# Patient Record
Sex: Female | Born: 1975 | Race: White | Hispanic: No | Smoking: Never smoker
Health system: Southern US, Community
[De-identification: ages and names within clinical notes are randomized; demographics above are authoritative.]

## PROBLEM LIST (undated history)

## (undated) DIAGNOSIS — K219 Gastro-esophageal reflux disease without esophagitis: Secondary | ICD-10-CM

## (undated) DIAGNOSIS — M1990 Unspecified osteoarthritis, unspecified site: Secondary | ICD-10-CM

## (undated) DIAGNOSIS — D229 Melanocytic nevi, unspecified: Secondary | ICD-10-CM

## (undated) HISTORY — PX: ABDOMINAL HYSTERECTOMY: SHX81

## (undated) HISTORY — DX: Melanocytic nevi, unspecified: D22.9

## (undated) HISTORY — PX: CHOLECYSTECTOMY: SHX55

---

## 2007-04-07 ENCOUNTER — Ambulatory Visit: Payer: Self-pay | Admitting: Obstetrics & Gynecology

## 2007-04-07 ENCOUNTER — Inpatient Hospital Stay: Payer: Self-pay | Admitting: General Surgery

## 2007-05-08 ENCOUNTER — Ambulatory Visit: Payer: Self-pay | Admitting: General Surgery

## 2007-05-12 ENCOUNTER — Ambulatory Visit: Payer: Self-pay | Admitting: General Surgery

## 2009-11-25 HISTORY — PX: SINUSOTOMY: SHX291

## 2010-01-31 ENCOUNTER — Ambulatory Visit: Payer: Self-pay

## 2010-02-06 ENCOUNTER — Inpatient Hospital Stay: Payer: Self-pay

## 2010-02-22 ENCOUNTER — Ambulatory Visit: Payer: Self-pay | Admitting: Otolaryngology

## 2011-12-24 ENCOUNTER — Ambulatory Visit: Payer: Self-pay

## 2012-04-03 ENCOUNTER — Ambulatory Visit: Payer: Self-pay | Admitting: Internal Medicine

## 2013-01-13 ENCOUNTER — Ambulatory Visit: Payer: Self-pay | Admitting: Internal Medicine

## 2013-03-25 ENCOUNTER — Emergency Department: Payer: Self-pay | Admitting: Emergency Medicine

## 2014-05-16 ENCOUNTER — Encounter: Payer: Self-pay | Admitting: Rheumatology

## 2014-05-25 ENCOUNTER — Encounter: Payer: Self-pay | Admitting: Rheumatology

## 2014-06-25 ENCOUNTER — Encounter: Payer: Self-pay | Admitting: Rheumatology

## 2014-12-03 DIAGNOSIS — J0110 Acute frontal sinusitis, unspecified: Secondary | ICD-10-CM | POA: Insufficient documentation

## 2015-01-02 DIAGNOSIS — E559 Vitamin D deficiency, unspecified: Secondary | ICD-10-CM | POA: Insufficient documentation

## 2017-01-23 ENCOUNTER — Other Ambulatory Visit: Payer: Self-pay | Admitting: Neurology

## 2017-01-23 DIAGNOSIS — R202 Paresthesia of skin: Secondary | ICD-10-CM

## 2017-01-23 DIAGNOSIS — R2681 Unsteadiness on feet: Secondary | ICD-10-CM

## 2017-01-31 ENCOUNTER — Encounter: Payer: Self-pay | Admitting: Radiology

## 2017-01-31 ENCOUNTER — Ambulatory Visit
Admission: RE | Admit: 2017-01-31 | Discharge: 2017-01-31 | Disposition: A | Discharge status: Home / Self Care | Payer: BLUE CROSS/BLUE SHIELD | Source: Ambulatory Visit | Attending: Neurology | Admitting: Neurology

## 2017-01-31 DIAGNOSIS — R2681 Unsteadiness on feet: Secondary | ICD-10-CM

## 2017-01-31 DIAGNOSIS — R42 Dizziness and giddiness: Secondary | ICD-10-CM | POA: Insufficient documentation

## 2017-01-31 DIAGNOSIS — R2689 Other abnormalities of gait and mobility: Secondary | ICD-10-CM | POA: Diagnosis not present

## 2017-01-31 DIAGNOSIS — R202 Paresthesia of skin: Secondary | ICD-10-CM | POA: Diagnosis present

## 2017-01-31 MED ORDER — GADOBENATE DIMEGLUMINE 529 MG/ML IV SOLN
20.0000 mL | Freq: Once | INTRAVENOUS | Status: AC | PRN
  Administered 2017-01-31: 10 mL via INTRAVENOUS

## 2017-02-21 ENCOUNTER — Ambulatory Visit: Payer: BLUE CROSS/BLUE SHIELD | Attending: Neurology

## 2017-02-21 DIAGNOSIS — I10 Essential (primary) hypertension: Secondary | ICD-10-CM | POA: Diagnosis not present

## 2017-02-21 DIAGNOSIS — G4733 Obstructive sleep apnea (adult) (pediatric): Secondary | ICD-10-CM | POA: Insufficient documentation

## 2017-02-21 DIAGNOSIS — Z881 Allergy status to other antibiotic agents status: Secondary | ICD-10-CM | POA: Insufficient documentation

## 2017-02-21 DIAGNOSIS — Z882 Allergy status to sulfonamides status: Secondary | ICD-10-CM | POA: Insufficient documentation

## 2017-02-21 DIAGNOSIS — E119 Type 2 diabetes mellitus without complications: Secondary | ICD-10-CM | POA: Diagnosis not present

## 2017-02-21 DIAGNOSIS — R0683 Snoring: Secondary | ICD-10-CM | POA: Diagnosis present

## 2017-02-25 ENCOUNTER — Ambulatory Visit: Admission: RE | Admit: 2017-02-25 | Payer: BLUE CROSS/BLUE SHIELD | Source: Ambulatory Visit

## 2017-02-25 ENCOUNTER — Other Ambulatory Visit: Payer: Self-pay | Admitting: Surgery

## 2017-03-03 ENCOUNTER — Ambulatory Visit
Admission: RE | Admit: 2017-03-03 | Discharge: 2017-03-03 | Disposition: A | Discharge status: Home / Self Care | Payer: BLUE CROSS/BLUE SHIELD | Source: Ambulatory Visit | Attending: Surgery | Admitting: Surgery

## 2017-03-03 ENCOUNTER — Other Ambulatory Visit: Payer: Self-pay

## 2017-03-03 DIAGNOSIS — K219 Gastro-esophageal reflux disease without esophagitis: Secondary | ICD-10-CM | POA: Insufficient documentation

## 2017-03-17 ENCOUNTER — Encounter: Payer: Self-pay | Admitting: Registered"

## 2017-03-17 ENCOUNTER — Encounter: Payer: BLUE CROSS/BLUE SHIELD | Attending: Surgery | Admitting: Registered"

## 2017-03-17 DIAGNOSIS — E669 Obesity, unspecified: Secondary | ICD-10-CM

## 2017-03-17 DIAGNOSIS — Z713 Dietary counseling and surveillance: Secondary | ICD-10-CM | POA: Insufficient documentation

## 2017-03-17 DIAGNOSIS — Z6839 Body mass index (BMI) 39.0-39.9, adult: Secondary | ICD-10-CM | POA: Diagnosis not present

## 2017-03-17 NOTE — Progress Notes (Signed)
Pre-Op Assessment Visit:  Pre-Operative Sleeve Gastrectomy Surgery  Medical Nutrition Therapy:  Appt start time: 8:00  End time:  9:00  Patient was seen on 03/17/2017 for Pre-Operative Nutrition Assessment. Assessment and letter of approval faxed to [facility] Bariatric Surgery Program coordinator on 03/17/2017.   Pt expectation of surgery: 60 lb loss, lower cholesterol and blood pressure  Pt expectation of Dietitian: guidance on nutrients needed to ensure adequacy   Start weight at [facility]: 230.5 BMI: 39.57  Pt does not like water flavored packs, no seafood, no eggs. 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   Pt states this is her only visit with [place] prior to having surgery.   24 hr Dietary Recall: First Meal: Premier Protein shake Snack: fruit Second Meal: wrap (lettuce, tomato, [place], cheese) or salad   Snack: fruit, sometimes skips Third Meal: sometimes skips, salad or wrap Snack: sometimes Premier Protein shake  Beverages: water, coke, tea  Encouraged to engage in 150 minutes of moderate physical activity including cardiovascular and weight baring weekly  Handouts given during visit include:  . Pre-Op Goals . Bariatric Surgery Protein Shakes During the appointment today the following Pre-Op Goals were reviewed with the patient: . Maintain or lose weight as instructed by your surgeon . Make healthy food choices . Begin to limit portion sizes . Limited concentrated sugars and fried foods . Keep fat/sugar in the single digits per serving on        food labels . Practice CHEWING your food  (aim for 30 chews per bite or until applesauce consistency) . Practice not drinking 15 minutes before, during, and 30 minutes after each meal/snack . Avoid all carbonated beverages  . Avoid/limit caffeinated beverages  . Avoid all sugar-sweetened beverages . Consume 3 meals per day; eat every 3-5 hours . Make a list of non-food related activities . Aim for 64-100  ounces of FLUID daily  . Aim for at least 60-80 grams of PROTEIN daily . Look for a liquid protein source that contain ?15 g protein and ?5 g carbohydrate  (ex: shakes, drinks, shots)  -Follow diet recommendations listed below   Energy and Macronutrient Recomendations: Calories: 1600 Carbohydrate: 180 Protein: 120 Fat: 44  Demonstrated degree of understanding via:  Teach Back  Teaching Method Utilized:  Visual Auditory Hands on  Barriers to learning/adherence to lifestyle change: busy work-life schedule  Patient to call the [facility] to enroll in Pre-Op and Post-Op Nutrition Education when surgery date is scheduled.

## 2017-03-24 ENCOUNTER — Encounter: Payer: BLUE CROSS/BLUE SHIELD | Admitting: Skilled Nursing Facility1

## 2017-03-24 ENCOUNTER — Encounter: Payer: Self-pay | Admitting: Skilled Nursing Facility1

## 2017-03-24 DIAGNOSIS — E669 Obesity, unspecified: Secondary | ICD-10-CM

## 2017-03-24 NOTE — Progress Notes (Signed)
  Pre-Operative Nutrition Class:  Appt start time: 7737   End time:  1830.  Patient was seen on 03/24/2017 for Pre-Operative Bariatric Surgery Education at the [facility].   Surgery date:  Surgery type:  Start weight at [facility]: 230 Weight today: 231.8  TANITA  BODY COMP RESULTS  N/A   BMI (kg/m^2)    Fat Mass (lbs)    Fat Free Mass (lbs)    Total Body Water (lbs)    Samples given per MNT protocol. Patient educated on appropriate usage: OpurityMultivitamin Lot # [id number] Exp: 04/19  Bariatric Advantage Calcium Citrate Lot # [id number] Exp:  ProemierProtein  Lot #[id number] Exp: 21/dec/2018  The following the learning objectives were met by the patient during this course:  Identify Pre-Op Dietary Goals and will begin 2 weeks pre-operatively  Identify appropriate sources of fluids and proteins   State protein recommendations and appropriate sources pre and post-operatively  Identify Post-Operative Dietary Goals and will follow for 2 weeks post-operatively  Identify appropriate multivitamin and calcium sources  Describe the need for physical activity post-operatively and will follow MD recommendations  State when to call healthcare provider regarding medication questions or post-operative complications  Handouts given during class include:  Pre-Op Bariatric Surgery Diet Handout  Protein Shake Handout  Post-Op Bariatric Surgery Nutrition Handout  BELT Program Information Flyer  Support Group Information Flyer  [facility] Bariatric Supplements Price List  [facility]: [facility] MD.

## 2017-04-10 ENCOUNTER — Encounter: Payer: Self-pay | Admitting: Skilled Nursing Facility1

## 2017-04-10 ENCOUNTER — Encounter: Payer: BLUE CROSS/BLUE SHIELD | Attending: Surgery | Admitting: Skilled Nursing Facility1

## 2017-04-10 DIAGNOSIS — E669 Obesity, unspecified: Secondary | ICD-10-CM

## 2017-04-10 DIAGNOSIS — Z6839 Body mass index (BMI) 39.0-39.9, adult: Secondary | ICD-10-CM | POA: Insufficient documentation

## 2017-04-10 DIAGNOSIS — Z713 Dietary counseling and surveillance: Secondary | ICD-10-CM | POA: Insufficient documentation

## 2017-04-10 NOTE — Progress Notes (Signed)
   Assessment:   1 s tSWL Appointment. Pt states her surgery date is tentatively May 29th. Pt states she will Start the pre-op diet today. Pt states she recently discovered after coming off Cymbalta she has reflux. Pt states she does not like water flavored packs, seafood, and eggs.  Start Wt at [facility]: 230.5 Wt: 233.6.4 BMI:40.06  MEDICATIONS: see list   DIETARY INTAKE:  24-hr recall:  B ( AM): protein shake Snk ( AM):  L ( PM): meat and vegetables Snk ( PM): protein shake D ( PM): meat and vegetable Beverages: water  Usual physical activity: walking  Diet to Follow: 1600 calories 180 g carbohydrates 120 g protein 44 g fat   Nutritional Diagnosis:  [place]-3.3 Overweight/obesity related to past poor dietary habits and physical inactivity as evidenced by patient w/ recent sleeve gastrectomy surgery following dietary guidelines for continued weight loss.    Intervention:  Nutrition counseling for upcoming Bariatric Surgery.  Barriers to learning/adherence to lifestyle change: none identified   Demonstrated degree of understanding via:  Teach Back   Monitoring/Evaluation:  Dietary intake, exercise,  and body weight prn.

## 2018-01-29 ENCOUNTER — Encounter: Payer: BLUE CROSS/BLUE SHIELD | Attending: Surgery | Admitting: Skilled Nursing Facility1

## 2018-01-29 ENCOUNTER — Encounter: Payer: Self-pay | Admitting: Skilled Nursing Facility1

## 2018-01-29 DIAGNOSIS — Z800 Family history of malignant neoplasm of digestive organs: Secondary | ICD-10-CM | POA: Insufficient documentation

## 2018-01-29 DIAGNOSIS — Z833 Family history of diabetes mellitus: Secondary | ICD-10-CM | POA: Insufficient documentation

## 2018-01-29 DIAGNOSIS — E7800 Pure hypercholesterolemia, unspecified: Secondary | ICD-10-CM | POA: Diagnosis not present

## 2018-01-29 DIAGNOSIS — Z8541 Personal history of malignant neoplasm of cervix uteri: Secondary | ICD-10-CM | POA: Insufficient documentation

## 2018-01-29 DIAGNOSIS — Z90710 Acquired absence of both cervix and uterus: Secondary | ICD-10-CM | POA: Insufficient documentation

## 2018-01-29 DIAGNOSIS — Z823 Family history of stroke: Secondary | ICD-10-CM | POA: Diagnosis not present

## 2018-01-29 DIAGNOSIS — Z79899 Other long term (current) drug therapy: Secondary | ICD-10-CM | POA: Diagnosis not present

## 2018-01-29 DIAGNOSIS — Z8042 Family history of malignant neoplasm of prostate: Secondary | ICD-10-CM | POA: Insufficient documentation

## 2018-01-29 DIAGNOSIS — Z882 Allergy status to sulfonamides status: Secondary | ICD-10-CM | POA: Insufficient documentation

## 2018-01-29 DIAGNOSIS — Z8261 Family history of arthritis: Secondary | ICD-10-CM | POA: Insufficient documentation

## 2018-01-29 DIAGNOSIS — Z803 Family history of malignant neoplasm of breast: Secondary | ICD-10-CM | POA: Insufficient documentation

## 2018-01-29 DIAGNOSIS — Z8041 Family history of malignant neoplasm of ovary: Secondary | ICD-10-CM | POA: Insufficient documentation

## 2018-01-29 DIAGNOSIS — Z881 Allergy status to other antibiotic agents status: Secondary | ICD-10-CM | POA: Diagnosis not present

## 2018-01-29 DIAGNOSIS — Z8249 Family history of ischemic heart disease and other diseases of the circulatory system: Secondary | ICD-10-CM | POA: Insufficient documentation

## 2018-01-29 DIAGNOSIS — F419 Anxiety disorder, unspecified: Secondary | ICD-10-CM | POA: Insufficient documentation

## 2018-01-29 DIAGNOSIS — M1990 Unspecified osteoarthritis, unspecified site: Secondary | ICD-10-CM | POA: Insufficient documentation

## 2018-01-29 DIAGNOSIS — E669 Obesity, unspecified: Secondary | ICD-10-CM

## 2018-01-29 DIAGNOSIS — Z9889 Other specified postprocedural states: Secondary | ICD-10-CM | POA: Insufficient documentation

## 2018-01-29 DIAGNOSIS — Z836 Family history of other diseases of the respiratory system: Secondary | ICD-10-CM | POA: Diagnosis not present

## 2018-01-29 DIAGNOSIS — Z713 Dietary counseling and surveillance: Secondary | ICD-10-CM | POA: Insufficient documentation

## 2018-01-29 NOTE — Progress Notes (Signed)
Sleeve Gastrectomy   Assessment:  2nd SWL Appointment.  Pt states after finding out she could not have surgery she was angry so then did not keep up with any healthy behavior changes. Pt states losing her job has caused a lot of stress at that time as well. Pt states she has acid reflux which is worsened by soda.   Start Wt at [facility]: 230.5 Wt: 240.6 BMI:41.26  MEDICATIONS: see list   DIETARY INTAKE:  24-hr recall:  B (7-7:30 AM): overnight oats with coconut almond milk  Snk (10 AM): fruit L ( PM): salad sometimes with chicken usually not with cheese and bacon bites and seeds and croutons  Snk (3 PM): raw veggies D ( 7PM): green beans and sweet potato  Beverages: flavored water, soda  Usual physical activity: stationary bike and treadmill (limited due to arthritis in knees)  Diet to Follow: 1600 calories 180 g carbohydrates 120 g protein 44 g fat   Nutritional Diagnosis:  [place]-3.3 Overweight/obesity related to past poor dietary habits and physical inactivity as evidenced by patient w/ recent sleeve gastrectomy surgery following dietary guidelines for continued weight loss.    Intervention:  Nutrition counseling for upcoming Bariatric Surgery. Goals: -On any nutrition label: single digits of fat and sugar and double digits of protein  -Go through the materials and take notes and write down your questions for pre-op  Barriers to learning/adherence to lifestyle change: none identified   Demonstrated degree of understanding via:  Teach Back   Monitoring/Evaluation:  Dietary intake, exercise,  and body weight prn.

## 2018-02-17 ENCOUNTER — Telehealth: Payer: Self-pay | Admitting: Skilled Nursing Facility1

## 2018-02-17 NOTE — Telephone Encounter (Signed)
Dietitian asked pt if she would like to come into pre-op class for a refresher.  Pt responded yes.

## 2018-02-20 NOTE — Progress Notes (Signed)
03-03-17 (Epic) EKG and CXR

## 2018-02-20 NOTE — Patient Instructions (Addendum)
[name]  02/20/2018   Your procedure is scheduled on: 03-02-18   Report to [place]  EntranceReport to admitting at 9:15 AM    Call this number if you have problems the morning of surgery [phone]   Remember: Do not eat food or drink liquids :After Midnight.     Take these medicines the morning of surgery with A SIP OF WATER: None                                You may not have any metal on your body including hair pins and              piercings  Do not wear jewelry, make-up, lotions, powders or perfumes, deodorant             Do not wear nail polish.  Do not shave  48 hours prior to surgery.               Do not bring valuables to the hospital. [place].  Contacts, dentures or bridgework may not be worn into surgery.  Leave suitcase in the car. After surgery it may be brought to your room.                 Please read over the following fact sheets you were given: _____________________________________________________________________           [facility] - Preparing for Surgery Before surgery, you can play an important role.  Because skin is not sterile, your skin needs to be as free of germs as possible.  You can reduce the number of germs on your skin by washing with CHG (chlorahexidine gluconate) soap before surgery.  CHG is an antiseptic cleaner which kills germs and bonds with the skin to continue killing germs even after washing. Please DO NOT use if you have an allergy to CHG or antibacterial soaps.  If your skin becomes reddened/irritated stop using the CHG and inform your nurse when you arrive at Short Stay. Do not shave (including legs and underarms) for at least 48 hours prior to the first CHG shower.  You may shave your face/neck. Please follow these instructions carefully:  1.  Shower with CHG Soap the night before surgery and the  morning of Surgery.  2.  If you choose  to wash your hair, wash your hair first as usual with your  normal  shampoo.  3.  After you shampoo, rinse your hair and body thoroughly to remove the  shampoo.                           4.  Use CHG as you would any other liquid soap.  You can apply chg directly  to the skin and wash                       Gently with a scrungie or clean washcloth.  5.  Apply the CHG Soap to your body ONLY FROM THE NECK DOWN.   Do not use on face/ open  Wound or open sores. Avoid contact with eyes, ears mouth and genitals (private parts).                       Wash face,  Genitals (private parts) with your normal soap.             6.  Wash thoroughly, paying special attention to the area where your surgery  will be performed.  7.  Thoroughly rinse your body with warm water from the neck down.  8.  DO NOT shower/wash with your normal soap after using and rinsing off  the CHG Soap.                9.  Pat yourself dry with a clean towel.            10.  Wear clean pajamas.            11.  Place clean sheets on your bed the night of your first shower and do not  sleep with pets. Day of Surgery : Do not apply any lotions/deodorants the morning of surgery.  Please wear clean clothes to the hospital/surgery center.  FAILURE TO FOLLOW THESE INSTRUCTIONS MAY RESULT IN THE CANCELLATION OF YOUR SURGERY PATIENT SIGNATURE_________________________________  NURSE SIGNATURE__________________________________

## 2018-02-23 ENCOUNTER — Other Ambulatory Visit: Payer: Self-pay

## 2018-02-23 ENCOUNTER — Encounter: Payer: BLUE CROSS/BLUE SHIELD | Attending: Surgery | Admitting: Skilled Nursing Facility1

## 2018-02-23 ENCOUNTER — Encounter (HOSPITAL_COMMUNITY): Payer: Self-pay

## 2018-02-23 ENCOUNTER — Encounter (HOSPITAL_COMMUNITY)
Admission: RE | Admit: 2018-02-23 | Discharge: 2018-02-23 | Disposition: A | Discharge status: Home / Self Care | Payer: BLUE CROSS/BLUE SHIELD | Source: Ambulatory Visit | Attending: Surgery | Admitting: Surgery

## 2018-02-23 DIAGNOSIS — Z79899 Other long term (current) drug therapy: Secondary | ICD-10-CM | POA: Insufficient documentation

## 2018-02-23 DIAGNOSIS — Z713 Dietary counseling and surveillance: Secondary | ICD-10-CM | POA: Insufficient documentation

## 2018-02-23 DIAGNOSIS — Z882 Allergy status to sulfonamides status: Secondary | ICD-10-CM | POA: Insufficient documentation

## 2018-02-23 DIAGNOSIS — Z01812 Encounter for preprocedural laboratory examination: Secondary | ICD-10-CM | POA: Insufficient documentation

## 2018-02-23 DIAGNOSIS — Z800 Family history of malignant neoplasm of digestive organs: Secondary | ICD-10-CM | POA: Insufficient documentation

## 2018-02-23 DIAGNOSIS — Z8042 Family history of malignant neoplasm of prostate: Secondary | ICD-10-CM | POA: Diagnosis not present

## 2018-02-23 DIAGNOSIS — M1990 Unspecified osteoarthritis, unspecified site: Secondary | ICD-10-CM | POA: Diagnosis not present

## 2018-02-23 DIAGNOSIS — E7800 Pure hypercholesterolemia, unspecified: Secondary | ICD-10-CM | POA: Insufficient documentation

## 2018-02-23 DIAGNOSIS — Z90710 Acquired absence of both cervix and uterus: Secondary | ICD-10-CM | POA: Diagnosis not present

## 2018-02-23 DIAGNOSIS — Z9889 Other specified postprocedural states: Secondary | ICD-10-CM | POA: Insufficient documentation

## 2018-02-23 DIAGNOSIS — Z823 Family history of stroke: Secondary | ICD-10-CM | POA: Diagnosis not present

## 2018-02-23 DIAGNOSIS — Z833 Family history of diabetes mellitus: Secondary | ICD-10-CM | POA: Diagnosis not present

## 2018-02-23 DIAGNOSIS — Z8541 Personal history of malignant neoplasm of cervix uteri: Secondary | ICD-10-CM | POA: Insufficient documentation

## 2018-02-23 DIAGNOSIS — Z8041 Family history of malignant neoplasm of ovary: Secondary | ICD-10-CM | POA: Diagnosis not present

## 2018-02-23 DIAGNOSIS — Z803 Family history of malignant neoplasm of breast: Secondary | ICD-10-CM | POA: Diagnosis not present

## 2018-02-23 DIAGNOSIS — Z881 Allergy status to other antibiotic agents status: Secondary | ICD-10-CM | POA: Diagnosis not present

## 2018-02-23 DIAGNOSIS — Z8261 Family history of arthritis: Secondary | ICD-10-CM | POA: Diagnosis not present

## 2018-02-23 DIAGNOSIS — Z8249 Family history of ischemic heart disease and other diseases of the circulatory system: Secondary | ICD-10-CM | POA: Diagnosis not present

## 2018-02-23 DIAGNOSIS — F419 Anxiety disorder, unspecified: Secondary | ICD-10-CM | POA: Insufficient documentation

## 2018-02-23 DIAGNOSIS — E669 Obesity, unspecified: Secondary | ICD-10-CM

## 2018-02-23 DIAGNOSIS — Z836 Family history of other diseases of the respiratory system: Secondary | ICD-10-CM | POA: Insufficient documentation

## 2018-02-23 HISTORY — DX: Gastro-esophageal reflux disease without esophagitis: K21.9

## 2018-02-23 HISTORY — DX: Unspecified osteoarthritis, unspecified site: M19.90

## 2018-02-23 LAB — BASIC METABOLIC PANEL
ANION GAP: 8 (ref 5–15)
BUN: 9 mg/dL (ref 6–20)
CHLORIDE: 103 mmol/L (ref 101–111)
CO2: 28 mmol/L (ref 22–32)
Calcium: 9.3 mg/dL (ref 8.9–10.3)
Creatinine, Ser: 0.71 mg/dL (ref 0.44–1.00)
GFR calc Af Amer: >60 mL/min (ref >60)
GLUCOSE: 106 mg/dL — AB (ref 65–99)
POTASSIUM: 4.1 mmol/L (ref 3.5–5.1)
Sodium: 139 mmol/L (ref 135–145)

## 2018-02-23 LAB — CBC
HEMATOCRIT: 42.4 % (ref 36.0–46.0)
HEMOGLOBIN: 14.3 g/dL (ref 12.0–15.0)
MCH: 31.8 pg (ref 26.0–34.0)
MCHC: 33.7 g/dL (ref 30.0–36.0)
MCV: 94.4 fL (ref 78.0–100.0)
Platelets: 377 10*3/uL (ref 150–400)
RBC: 4.49 MIL/uL (ref 3.87–5.11)
RDW: 12.7 % (ref 11.5–15.5)
WBC: 7.9 10*3/uL (ref 4.0–10.5)

## 2018-02-24 ENCOUNTER — Encounter: Payer: Self-pay | Admitting: Skilled Nursing Facility1

## 2018-02-24 NOTE — Progress Notes (Signed)
Pre-Operative Nutrition Class:  Appt start time: 4481   End time:  1830.  Patient was seen on 02/23/2018 for Pre-Operative Bariatric Surgery Education at the [facility].   Surgery date:  Surgery type: sleeve Start weight at [facility]: 230.5 Weight today: 235  Samples given per MNT protocol. Patient educated on appropriate usage: Bariatric Advantage Multivitamin Lot # [id number] Exp: 07/19  Bariatric Advantage Calcium  Lot # [id number] Exp: sep-06-2018  Renee Pain Protein Shake Lot # [id number] Exp: oct-13-2019  The following the learning objectives were met by the patient during this course:  Identify Pre-Op Dietary Goals and will begin 2 weeks pre-operatively  Identify appropriate sources of fluids and proteins   State protein recommendations and appropriate sources pre and post-operatively  Identify Post-Operative Dietary Goals and will follow for 2 weeks post-operatively  Identify appropriate multivitamin and calcium sources  Describe the need for physical activity post-operatively and will follow MD recommendations  State when to call healthcare provider regarding medication questions or post-operative complications  Handouts given during class include:  Pre-Op Bariatric Surgery Diet Handout  Protein Shake Handout  Post-Op Bariatric Surgery Nutrition Handout  BELT Program Information Flyer  Support Group Information Flyer  [facility] Bariatric Supplements Price List  [facility]: [facility] MD.

## 2018-03-01 NOTE — H&P (Signed)
Krista Ferguson  Documented: 02/06/2018 2:45 PM  Location: [place] Office  Patient #: [id number]  DOB: 1976/04/11  Divorced / Language: Krista Ferguson / Race: White  Female      History of Present Illness Krista Ferguson B. Hassell Done MD; 02/06/2018 3:07 PM)  The patient is a 42 year old female who presents for a bariatric surgery evaluation. 42 year old WF referred by Dr. Rebecka Apley and scheduled for sleeve gastrectomy on April 8. She lives in [place] and commutes to [place] where she works for [employer].   I answered her questions about the surgery. Her UGI showed some reflux but no hiatal hernia and she has no history of hiatal hernia. She has had a prior lap chole and a hysterectomy.     Her BMI is 42 and her weight is 239.       Allergies Malachi Bonds, CMA; 02/06/2018 2:45 PM)  Levaquin *FLUOROQUINOLONES*   Sulfa Antibiotics     Medication History (Chemira Jones, CMA; 02/06/2018 2:46 PM)  Loratadine (10MG  Tablet, Oral) Active.    Vitals (Chemira Jones CMA; 02/06/2018 2:45 PM)  02/06/2018 2:45 PM  Weight: 239 lb Height: 63in  Body Surface Area: 2.09 m Body Mass Index: 42.34 kg/m   Pulse: 49 (Regular)   BP: 130/78 (Sitting, Left Arm, Standard)    Physical Exam (Alailah Safley B. Hassell Done MD; 02/06/2018 3:08 PM)  General  Note: WD obese WF NAD  HEENT unremarkable  Neck supple  Chest clear  Heart SR  Abdomen obese  Ext FROM      Assessment & Plan Krista Ferguson B. Hassell Done MD; 02/06/2018 3:09 PM)  MORBID OBESITY, UNSPECIFIED OBESITY TYPE (E66.01)  Sleeve gastrectomy.  She is aware of the risk and benefits of the procedure.    Matt B. Hassell Done, MD, FACS

## 2018-03-02 ENCOUNTER — Encounter (HOSPITAL_COMMUNITY): Payer: Self-pay | Admitting: *Deleted

## 2018-03-02 ENCOUNTER — Inpatient Hospital Stay (HOSPITAL_COMMUNITY): Payer: BLUE CROSS/BLUE SHIELD | Admitting: Certified Registered Nurse Anesthetist

## 2018-03-02 ENCOUNTER — Encounter (HOSPITAL_COMMUNITY)
Admission: RE | Disposition: A | Discharge status: Home / Self Care | Payer: Self-pay | Source: Ambulatory Visit | Attending: Surgery

## 2018-03-02 ENCOUNTER — Other Ambulatory Visit: Payer: Self-pay

## 2018-03-02 ENCOUNTER — Inpatient Hospital Stay (HOSPITAL_COMMUNITY)
Admission: RE | Admit: 2018-03-02 | Discharge: 2018-03-03 | DRG: 621 | Disposition: A | Discharge status: Home / Self Care | Payer: BLUE CROSS/BLUE SHIELD | Source: Ambulatory Visit | Attending: Surgery | Admitting: Surgery

## 2018-03-02 DIAGNOSIS — K449 Diaphragmatic hernia without obstruction or gangrene: Secondary | ICD-10-CM | POA: Diagnosis present

## 2018-03-02 DIAGNOSIS — Z882 Allergy status to sulfonamides status: Secondary | ICD-10-CM | POA: Diagnosis not present

## 2018-03-02 DIAGNOSIS — Z9049 Acquired absence of other specified parts of digestive tract: Secondary | ICD-10-CM

## 2018-03-02 DIAGNOSIS — K219 Gastro-esophageal reflux disease without esophagitis: Secondary | ICD-10-CM | POA: Diagnosis present

## 2018-03-02 DIAGNOSIS — Z9884 Bariatric surgery status: Secondary | ICD-10-CM

## 2018-03-02 DIAGNOSIS — Z881 Allergy status to other antibiotic agents status: Secondary | ICD-10-CM

## 2018-03-02 DIAGNOSIS — M1990 Unspecified osteoarthritis, unspecified site: Secondary | ICD-10-CM | POA: Diagnosis present

## 2018-03-02 DIAGNOSIS — Z90710 Acquired absence of both cervix and uterus: Secondary | ICD-10-CM

## 2018-03-02 DIAGNOSIS — Z6841 Body Mass Index (BMI) 40.0 and over, adult: Secondary | ICD-10-CM | POA: Diagnosis not present

## 2018-03-02 HISTORY — PX: LAPAROSCOPIC GASTRIC SLEEVE RESECTION: SHX5895

## 2018-03-02 LAB — COMPREHENSIVE METABOLIC PANEL
ALT: 21 U/L (ref 14–54)
AST: 20 U/L (ref 15–41)
Albumin: 4 g/dL (ref 3.5–5.0)
Alkaline Phosphatase: 58 U/L (ref 38–126)
Anion gap: 11 (ref 5–15)
BUN: 12 mg/dL (ref 6–20)
CHLORIDE: 103 mmol/L (ref 101–111)
CO2: 24 mmol/L (ref 22–32)
CREATININE: 0.61 mg/dL (ref 0.44–1.00)
Calcium: 9.2 mg/dL (ref 8.9–10.3)
GFR calc Af Amer: >60 mL/min (ref >60)
Glucose, Bld: 92 mg/dL (ref 65–99)
POTASSIUM: 4 mmol/L (ref 3.5–5.1)
SODIUM: 138 mmol/L (ref 135–145)
Total Bilirubin: 0.8 mg/dL (ref 0.3–1.2)
Total Protein: 7.7 g/dL (ref 6.5–8.1)

## 2018-03-02 LAB — CBC WITH DIFFERENTIAL/PLATELET
BASOS ABS: 0 10*3/uL (ref 0.0–0.1)
BASOS PCT: 0 %
Eosinophils Absolute: 0.1 10*3/uL (ref 0.0–0.7)
Eosinophils Relative: 1 %
HEMATOCRIT: 42.8 % (ref 36.0–46.0)
Hemoglobin: 13.8 g/dL (ref 12.0–15.0)
LYMPHS PCT: 35 %
Lymphs Abs: 2.8 10*3/uL (ref 0.7–4.0)
MCH: 30.5 pg (ref 26.0–34.0)
MCHC: 32.2 g/dL (ref 30.0–36.0)
MCV: 94.7 fL (ref 78.0–100.0)
MONO ABS: 0.7 10*3/uL (ref 0.1–1.0)
Monocytes Relative: 8 %
Neutro Abs: 4.4 10*3/uL (ref 1.7–7.7)
Neutrophils Relative %: 56 %
Platelets: 353 10*3/uL (ref 150–400)
RBC: 4.52 MIL/uL (ref 3.87–5.11)
RDW: 12.9 % (ref 11.5–15.5)
WBC: 7.9 10*3/uL (ref 4.0–10.5)

## 2018-03-02 LAB — TYPE AND SCREEN
ABO/RH(D): O POS
ANTIBODY SCREEN: NEGATIVE

## 2018-03-02 LAB — ABO/RH: ABO/RH(D): O POS

## 2018-03-02 SURGERY — GASTRECTOMY, SLEEVE, LAPAROSCOPIC
Anesthesia: General | Site: Abdomen

## 2018-03-02 MED ORDER — APREPITANT 40 MG PO CAPS
40.0000 mg | ORAL_CAPSULE | ORAL | Status: AC
  Administered 2018-03-02: 40 mg via ORAL
  Filled 2018-03-02: qty 1

## 2018-03-02 MED ORDER — LACTATED RINGERS IV SOLN
INTRAVENOUS | Status: DC
  Administered 2018-03-02 (×2): via INTRAVENOUS

## 2018-03-02 MED ORDER — 0.9 % SODIUM CHLORIDE (POUR BTL) OPTIME
TOPICAL | Status: DC | PRN
  Administered 2018-03-02: 1000 mL

## 2018-03-02 MED ORDER — HYDROMORPHONE HCL 1 MG/ML IJ SOLN
INTRAMUSCULAR | Status: AC
  Filled 2018-03-02: qty 1

## 2018-03-02 MED ORDER — OXYCODONE HCL 5 MG PO TABS: 5.0000 mg | ORAL_TABLET | Freq: Once | ORAL | Status: DC | PRN

## 2018-03-02 MED ORDER — PANTOPRAZOLE SODIUM 40 MG IV SOLR
40.0000 mg | Freq: Every day | INTRAVENOUS | Status: DC
  Administered 2018-03-02: 40 mg via INTRAVENOUS
  Filled 2018-03-02: qty 40

## 2018-03-02 MED ORDER — CHLORHEXIDINE GLUCONATE CLOTH 2 % EX PADS: 6.0000 | MEDICATED_PAD | Freq: Once | CUTANEOUS | Status: DC

## 2018-03-02 MED ORDER — BUPIVACAINE LIPOSOME 1.3 % IJ SUSP
20.0000 mL | Freq: Once | INTRAMUSCULAR | Status: AC
  Administered 2018-03-02: 20 mL
  Filled 2018-03-02: qty 20

## 2018-03-02 MED ORDER — SUGAMMADEX SODIUM 200 MG/2ML IV SOLN
INTRAVENOUS | Status: DC | PRN
  Administered 2018-03-02: 200 mg via INTRAVENOUS

## 2018-03-02 MED ORDER — HYDROMORPHONE HCL 1 MG/ML IJ SOLN
0.2500 mg | INTRAMUSCULAR | Status: DC | PRN
  Administered 2018-03-02 (×2): 0.5 mg via INTRAVENOUS

## 2018-03-02 MED ORDER — DEXAMETHASONE SODIUM PHOSPHATE 4 MG/ML IJ SOLN: 4.0000 mg | INTRAMUSCULAR | Status: DC

## 2018-03-02 MED ORDER — PROPOFOL 10 MG/ML IV BOLUS
INTRAVENOUS | Status: DC | PRN
  Administered 2018-03-02: 200 mg via INTRAVENOUS

## 2018-03-02 MED ORDER — ONDANSETRON HCL 4 MG/2ML IJ SOLN
INTRAMUSCULAR | Status: AC
  Filled 2018-03-02: qty 2

## 2018-03-02 MED ORDER — HEPARIN SODIUM (PORCINE) 5000 UNIT/ML IJ SOLN
5000.0000 [IU] | Freq: Three times a day (TID) | INTRAMUSCULAR | Status: DC
  Administered 2018-03-02 – 2018-03-03 (×2): 5000 [IU] via SUBCUTANEOUS
  Filled 2018-03-02 (×2): qty 1

## 2018-03-02 MED ORDER — ACETAMINOPHEN 160 MG/5ML PO SOLN
650.0000 mg | Freq: Four times a day (QID) | ORAL | Status: DC
  Administered 2018-03-02 – 2018-03-03 (×2): 650 mg via ORAL
  Filled 2018-03-02 (×2): qty 20.3

## 2018-03-02 MED ORDER — ROCURONIUM BROMIDE 50 MG/5ML IV SOSY
PREFILLED_SYRINGE | INTRAVENOUS | Status: DC | PRN
  Administered 2018-03-02: 20 mg via INTRAVENOUS
  Administered 2018-03-02: 40 mg via INTRAVENOUS
  Administered 2018-03-02: 20 mg via INTRAVENOUS
  Administered 2018-03-02: 10 mg via INTRAVENOUS

## 2018-03-02 MED ORDER — DEXAMETHASONE SODIUM PHOSPHATE 10 MG/ML IJ SOLN
INTRAMUSCULAR | Status: AC
  Filled 2018-03-02: qty 1

## 2018-03-02 MED ORDER — LIDOCAINE 2% (20 MG/ML) 5 ML SYRINGE
INTRAMUSCULAR | Status: DC | PRN
  Administered 2018-03-02: 1.5 mg/kg/h via INTRAVENOUS

## 2018-03-02 MED ORDER — MEPERIDINE HCL 50 MG/ML IJ SOLN: 6.2500 mg | INTRAMUSCULAR | Status: DC | PRN

## 2018-03-02 MED ORDER — PROMETHAZINE HCL 25 MG/ML IJ SOLN
INTRAMUSCULAR | Status: AC
  Filled 2018-03-02: qty 1

## 2018-03-02 MED ORDER — PROMETHAZINE HCL 25 MG/ML IJ SOLN
6.2500 mg | INTRAMUSCULAR | Status: DC | PRN
  Administered 2018-03-02: 12.5 mg via INTRAVENOUS

## 2018-03-02 MED ORDER — SODIUM CHLORIDE 0.9 % IJ SOLN
INTRAMUSCULAR | Status: AC
  Filled 2018-03-02: qty 10

## 2018-03-02 MED ORDER — LIDOCAINE 2% (20 MG/ML) 5 ML SYRINGE
INTRAMUSCULAR | Status: DC | PRN
  Administered 2018-03-02: 100 mg via INTRAVENOUS

## 2018-03-02 MED ORDER — OXYCODONE HCL 5 MG/5ML PO SOLN: 5.0000 mg | ORAL | Status: DC | PRN

## 2018-03-02 MED ORDER — SUGAMMADEX SODIUM 200 MG/2ML IV SOLN
INTRAVENOUS | Status: AC
  Filled 2018-03-02: qty 2

## 2018-03-02 MED ORDER — ACETAMINOPHEN 500 MG PO TABS
1000.0000 mg | ORAL_TABLET | ORAL | Status: AC
  Administered 2018-03-02: 1000 mg via ORAL
  Filled 2018-03-02: qty 2

## 2018-03-02 MED ORDER — OXYCODONE HCL 5 MG/5ML PO SOLN: 5.0000 mg | Freq: Once | ORAL | Status: DC | PRN

## 2018-03-02 MED ORDER — CEFOTETAN DISODIUM-DEXTROSE 2-2.08 GM-%(50ML) IV SOLR
2.0000 g | INTRAVENOUS | Status: AC
  Administered 2018-03-02: 2 g via INTRAVENOUS
  Filled 2018-03-02: qty 50

## 2018-03-02 MED ORDER — ONDANSETRON HCL 4 MG/2ML IJ SOLN: 4.0000 mg | INTRAMUSCULAR | Status: DC | PRN

## 2018-03-02 MED ORDER — SUCCINYLCHOLINE CHLORIDE 200 MG/10ML IV SOSY
PREFILLED_SYRINGE | INTRAVENOUS | Status: AC
  Filled 2018-03-02: qty 10

## 2018-03-02 MED ORDER — PROPOFOL 10 MG/ML IV BOLUS
INTRAVENOUS | Status: AC
  Filled 2018-03-02: qty 20

## 2018-03-02 MED ORDER — FENTANYL CITRATE (PF) 250 MCG/5ML IJ SOLN
INTRAMUSCULAR | Status: DC | PRN
  Administered 2018-03-02 (×4): 50 ug via INTRAVENOUS

## 2018-03-02 MED ORDER — KETAMINE HCL 10 MG/ML IJ SOLN
INTRAMUSCULAR | Status: DC | PRN
  Administered 2018-03-02: 30 mg via INTRAVENOUS

## 2018-03-02 MED ORDER — ROCURONIUM BROMIDE 10 MG/ML (PF) SYRINGE
PREFILLED_SYRINGE | INTRAVENOUS | Status: AC
  Filled 2018-03-02: qty 5

## 2018-03-02 MED ORDER — SODIUM CHLORIDE 0.9 % IJ SOLN
INTRAMUSCULAR | Status: DC | PRN
  Administered 2018-03-02: 10 mL

## 2018-03-02 MED ORDER — HEPARIN SODIUM (PORCINE) 5000 UNIT/ML IJ SOLN
5000.0000 [IU] | INTRAMUSCULAR | Status: AC
  Administered 2018-03-02: 5000 [IU] via SUBCUTANEOUS
  Filled 2018-03-02: qty 1

## 2018-03-02 MED ORDER — PREMIER PROTEIN SHAKE
2.0000 [oz_av] | ORAL | Status: DC
  Administered 2018-03-03 (×2): 2 [oz_av] via ORAL

## 2018-03-02 MED ORDER — EPHEDRINE SULFATE 50 MG/ML IJ SOLN
INTRAMUSCULAR | Status: DC | PRN
  Administered 2018-03-02: 10 mg via INTRAVENOUS

## 2018-03-02 MED ORDER — LIDOCAINE HCL 2 % IJ SOLN
INTRAMUSCULAR | Status: AC
  Filled 2018-03-02: qty 20

## 2018-03-02 MED ORDER — MORPHINE SULFATE (PF) 4 MG/ML IV SOLN: 1.0000 mg | INTRAVENOUS | Status: DC | PRN

## 2018-03-02 MED ORDER — SUCCINYLCHOLINE CHLORIDE 200 MG/10ML IV SOSY
PREFILLED_SYRINGE | INTRAVENOUS | Status: DC | PRN
  Administered 2018-03-02: 100 mg via INTRAVENOUS

## 2018-03-02 MED ORDER — LIDOCAINE 2% (20 MG/ML) 5 ML SYRINGE
INTRAMUSCULAR | Status: AC
  Filled 2018-03-02: qty 5

## 2018-03-02 MED ORDER — SCOPOLAMINE 1 MG/3DAYS TD PT72
1.0000 | MEDICATED_PATCH | TRANSDERMAL | Status: DC
  Administered 2018-03-02: 1.5 mg via TRANSDERMAL
  Filled 2018-03-02: qty 1

## 2018-03-02 MED ORDER — DEXAMETHASONE SODIUM PHOSPHATE 10 MG/ML IJ SOLN
INTRAMUSCULAR | Status: DC | PRN
  Administered 2018-03-02: 5 mg via INTRAVENOUS

## 2018-03-02 MED ORDER — LACTATED RINGERS IR SOLN
Status: DC | PRN
  Administered 2018-03-02: 1000 mL

## 2018-03-02 MED ORDER — MIDAZOLAM HCL 5 MG/5ML IJ SOLN
INTRAMUSCULAR | Status: DC | PRN
  Administered 2018-03-02: 2 mg via INTRAVENOUS

## 2018-03-02 MED ORDER — ONDANSETRON HCL 4 MG/2ML IJ SOLN
INTRAMUSCULAR | Status: DC | PRN
  Administered 2018-03-02: 4 mg via INTRAVENOUS

## 2018-03-02 MED ORDER — FENTANYL CITRATE (PF) 250 MCG/5ML IJ SOLN
INTRAMUSCULAR | Status: AC
  Filled 2018-03-02: qty 5

## 2018-03-02 MED ORDER — KCL IN DEXTROSE-NACL 20-5-0.45 MEQ/L-%-% IV SOLN
INTRAVENOUS | Status: DC
  Administered 2018-03-02: 19:00:00 via INTRAVENOUS
  Administered 2018-03-03: 100 mL via INTRAVENOUS
  Filled 2018-03-02 (×2): qty 1000

## 2018-03-02 MED ORDER — GABAPENTIN 300 MG PO CAPS
300.0000 mg | ORAL_CAPSULE | ORAL | Status: AC
  Administered 2018-03-02: 300 mg via ORAL
  Filled 2018-03-02: qty 1

## 2018-03-02 MED ORDER — MIDAZOLAM HCL 2 MG/2ML IJ SOLN
INTRAMUSCULAR | Status: AC
  Filled 2018-03-02: qty 2

## 2018-03-02 MED ORDER — EPHEDRINE 5 MG/ML INJ
INTRAVENOUS | Status: AC
  Filled 2018-03-02: qty 10

## 2018-03-02 SURGICAL SUPPLY — 56 items
APPLICATOR COTTON TIP 6IN STRL (MISCELLANEOUS) IMPLANT
APPLIER CLIP 5 13 M/L LIGAMAX5 (MISCELLANEOUS)
APPLIER CLIP ROT 10 11.4 M/L (STAPLE)
APPLIER CLIP ROT 13.4 12 LRG (CLIP) ×2
BLADE SURG 15 STRL LF DISP TIS (BLADE) ×1 IMPLANT
BLADE SURG 15 STRL SS (BLADE) ×1
CABLE HIGH FREQUENCY MONO STRZ (ELECTRODE) ×2 IMPLANT
CLIP APPLIE 5 13 M/L LIGAMAX5 (MISCELLANEOUS) IMPLANT
CLIP APPLIE ROT 10 11.4 M/L (STAPLE) IMPLANT
CLIP APPLIE ROT 13.4 12 LRG (CLIP) ×1 IMPLANT
DERMABOND ADVANCED (GAUZE/BANDAGES/DRESSINGS) ×1
DERMABOND ADVANCED .7 DNX12 (GAUZE/BANDAGES/DRESSINGS) ×1 IMPLANT
DEVICE SUT QUICK LOAD TK 5 (STAPLE) ×4 IMPLANT
DEVICE SUT TI-KNOT TK 5X26 (MISCELLANEOUS) ×2 IMPLANT
DEVICE SUTURE ENDOST 10MM (ENDOMECHANICALS) ×2 IMPLANT
DISSECTOR BLUNT TIP ENDO 5MM (MISCELLANEOUS) IMPLANT
ELECT REM PT RETURN 15FT ADLT (MISCELLANEOUS) ×2 IMPLANT
GAUZE SPONGE 4X4 12PLY STRL (GAUZE/BANDAGES/DRESSINGS) IMPLANT
GLOVE BIOGEL M 8.0 STRL (GLOVE) ×2 IMPLANT
GOWN STRL REUS W/TWL XL LVL3 (GOWN DISPOSABLE) ×8 IMPLANT
GRASPER SUT TROCAR 14GX15 (MISCELLANEOUS) ×2 IMPLANT
HANDLE STAPLE EGIA 4 XL (STAPLE) ×2 IMPLANT
HOVERMATT SINGLE USE (MISCELLANEOUS) ×2 IMPLANT
KIT BASIN OR (CUSTOM PROCEDURE TRAY) ×2 IMPLANT
MARKER SKIN DUAL TIP RULER LAB (MISCELLANEOUS) ×2 IMPLANT
NEEDLE SPNL 22GX3.5 QUINCKE BK (NEEDLE) ×2 IMPLANT
PACK UNIVERSAL I (CUSTOM PROCEDURE TRAY) ×2 IMPLANT
RELOAD TRI 45 ART MED THCK BLK (STAPLE) ×2 IMPLANT
RELOAD TRI 45 ART MED THCK PUR (STAPLE) IMPLANT
RELOAD TRI 60 ART MED THCK BLK (STAPLE) ×2 IMPLANT
RELOAD TRI 60 ART MED THCK PUR (STAPLE) ×4 IMPLANT
SCISSORS LAP 5X45 EPIX DISP (ENDOMECHANICALS) IMPLANT
SET IRRIG TUBING LAPAROSCOPIC (IRRIGATION / IRRIGATOR) ×2 IMPLANT
SHEARS HARMONIC ACE PLUS 45CM (MISCELLANEOUS) ×2 IMPLANT
SLEEVE ADV FIXATION 5X100MM (TROCAR) ×4 IMPLANT
SLEEVE GASTRECTOMY 36FR VISIGI (MISCELLANEOUS) ×2 IMPLANT
SOLUTION ANTI FOG 6CC (MISCELLANEOUS) ×2 IMPLANT
SPONGE LAP 18X18 X RAY DECT (DISPOSABLE) ×2 IMPLANT
STAPLER VISISTAT 35W (STAPLE) ×2 IMPLANT
SUT MNCRL AB 4-0 PS2 18 (SUTURE) ×2 IMPLANT
SUT SURGIDAC NAB ES-9 0 48 120 (SUTURE) ×4 IMPLANT
SUT VIC AB 4-0 SH 18 (SUTURE) ×2 IMPLANT
SUT VICRYL 0 TIES 12 18 (SUTURE) ×2 IMPLANT
SYR 10ML ECCENTRIC (SYRINGE) ×2 IMPLANT
SYR 20CC LL (SYRINGE) ×2 IMPLANT
SYR 50ML LL SCALE MARK (SYRINGE) ×2 IMPLANT
TOWEL OR 17X26 10 PK STRL BLUE (TOWEL DISPOSABLE) ×4 IMPLANT
TOWEL OR NON WOVEN STRL DISP B (DISPOSABLE) ×2 IMPLANT
TRAY FOLEY W/METER SILVER 16FR (SET/KITS/TRAYS/PACK) IMPLANT
TROCAR ADV FIXATION 5X100MM (TROCAR) ×2 IMPLANT
TROCAR BLADELESS 15MM (ENDOMECHANICALS) ×2 IMPLANT
TROCAR BLADELESS OPT 5 100 (ENDOMECHANICALS) ×2 IMPLANT
TUBE CALIBRATION LAPBAND (TUBING) IMPLANT
TUBING CONNECTING 10 (TUBING) ×2 IMPLANT
TUBING ENDO SMARTCAP (MISCELLANEOUS) ×2 IMPLANT
TUBING INSUF HEATED (TUBING) ×2 IMPLANT

## 2018-03-02 NOTE — Anesthesia Postprocedure Evaluation (Signed)
Anesthesia Post Note  Patient: Krista Ferguson  Procedure(s) Performed: LAPAROSCOPIC GASTRIC SLEEVE RESECTION WITH HIATAL HERNIA REPAIR AND UPPER ENDOSCOPY (N/A Abdomen)     Patient location during evaluation: PACU Anesthesia Type: General Level of consciousness: awake and alert Pain management: pain level controlled Vital Signs Assessment: post-procedure vital signs reviewed and stable Respiratory status: spontaneous breathing, nonlabored ventilation and respiratory function stable Cardiovascular status: blood pressure returned to baseline and stable Postop Assessment: no apparent nausea or vomiting Anesthetic complications: no    Last Vitals:  Vitals:   03/02/18 1345 03/02/18 1400  BP: (!) 144/91 (!) 140/99  Pulse: 83 79  Resp: 16 15  Temp:    SpO2: 99% 98%    Last Pain:  Vitals:   03/02/18 1400  TempSrc:   PainSc: [place]

## 2018-03-02 NOTE — Op Note (Signed)
Name:  Krista Ferguson MRN: [id number] Date of Surgery: 03/02/2018  Preop Diagnosis:  Morbid Obesity  Postop Diagnosis:  Morbid Obesity (Weight - 239, BMI - 42), S/P Gastric Sleeve resection  Procedure:  Upper endoscopy  (Intraoperative)  Surgeon:  Alphonsa Overall, M.D.  Anesthesia:  GET  Indications for procedure: Krista Ferguson is a 42 y.o. female whose primary care physician is Cletis Athens, MD and has completed a gastric sleeve resection today for weight loss by Dr. Hassell Done.  I am doing an intraoperative upper endoscopy to evaluate the gastric pouch after the sleeve gastrectomy.  Operative Note: The patient is under general anesthesia.  Dr. Hassell Done is laparoscoping the patient while I do an upper endoscopy to evaluate the stomach pouch.  With the patient intubated, I passed the Pentax upper endoscope without difficulty down the esophagus.  The esophagus was unremarkable.  The esophago-gastric junction was at 37 cm.    The mucosa of the stomach looked viable and the staple line was intact without bleeding.  I advanced the scope to the pylorus, but did not go through it.  While I insufflated the stomach pouch with air, Dr. Hassell Done  flooded the upper abdomen with saline to put the gastric pouch under saline.  There was no bubbling or evidence of a leak.  There was no evidence of narrowing of the pouch and the gastric sleeve looked tubular.  The scope was then withdrawn.  The esophagus was unremarkable and the patient tolerated the endoscopy without difficulty.  Alphonsa Overall, MD, [facility] Pager: [phone] Office phone:  [phone]

## 2018-03-02 NOTE — Op Note (Addendum)
Surgeon: Kaylyn Lim, MD, FACS  Asst:  Alphonsa Overall, MD, FACS  Anes:  General endotracheal  Procedure: Laparoscopic sleeve gastrectomy; posterior hiatal hernia closure with 2 sutures; and upper endoscopy by Dr. Lucia Gaskins.    Diagnosis: Morbid obesity  Complications: none  EBL:   20 cc  Description of Procedure:  The patient was take to OR 2 and given general anesthesia.  The abdomen was prepped with Technicare and draped sterilely.  A timeout was performed.  Access to the abdomen was achieved with a 5 mm Optiview through the left upper quadrant.  Following insufflation, the state of the abdomen was found to be with upper abdominal adhesions which were taken down.  The calibration tubing was inserted and with 10 cc of air in the balloon it was positive for a hiatal hernia.  This correlated with her history of reflux and the fact that she takes omeprazole although the upper GI did not show anything.  Posterior dissection was performed and 2 sutures were placed posteriorly with Ty Knot.  Reassessment with the calibration tubing did not allow the balloon to pull up into the esophagus..  The ViSiGi 36Fr tube was inserted to deflate the stomach and was pulled back into the esophagus.    The pylorus was identified and we measured 5 cm back and marked the antrum.  At that point we began dissection to take down the greater curvature of the stomach using the Harmonic scalpel.  This dissection was taken all the way up to the left crus.  Posterior attachments of the stomach were also taken down.    The ViSiGi tube was then passed into the antrum and suction applied so that it was snug along the lessor curvature.  The "crow's foot" or incisura was identified.  The sleeve gastrectomy was begun using the [employer] stapler beginning with a 4.5 black load with TRS followed by a 6 cm black load with TRS.  This was followed by several applications of a purple load with TRS.Marland Kitchen  When the sleeve was complete  the tube was taken off suction and insufflated briefly.  The tube was withdrawn.  Upper endoscopy was then performed by Dr. Lucia Gaskins.     The specimen was extracted through the 15 trocar site.  Wounds were infiltrated with Exparel and closed with 4-0 monocryl.  The 15 trocar site was closed with a 0 vicryl with the PMI device.  Dermabond was used on the skin.    Matt B. Hassell Done, [place], [facility], [place]

## 2018-03-02 NOTE — Anesthesia Procedure Notes (Signed)
Procedure Name: Intubation Date/Time: 03/02/2018 10:55 AM Performed by: Maxwell Caul, CRNA Pre-anesthesia Checklist: Patient identified, Emergency Drugs available, Suction available and Patient being monitored Patient Re-evaluated:Patient Re-evaluated prior to induction Oxygen Delivery Method: Circle system utilized Preoxygenation: Pre-oxygenation with 100% oxygen Induction Type: IV induction Ventilation: Mask ventilation without difficulty Laryngoscope Size: Mac and 4 Grade View: Grade II Tube type: Oral Tube size: 7.5 mm Number of attempts: 1 Airway Equipment and Method: Stylet Placement Confirmation: ETT inserted through vocal cords under direct vision,  positive ETCO2 and breath sounds checked- equal and bilateral Secured at: 21 cm Tube secured with: Tape Dental Injury: Teeth and Oropharynx as per pre-operative assessment

## 2018-03-02 NOTE — Anesthesia Preprocedure Evaluation (Signed)
Anesthesia Evaluation  Patient identified by MRN, date of birth, ID band Patient awake    Reviewed: Allergy & Precautions, NPO status , Patient's Chart, lab work & pertinent test results  Airway Mallampati: II  TM Distance: >3 FB Neck ROM: Full    Dental no notable dental hx.    Pulmonary neg pulmonary ROS,    Pulmonary exam normal breath sounds clear to auscultation       Cardiovascular negative cardio ROS Normal cardiovascular exam Rhythm:Regular Rate:Normal     Neuro/Psych negative neurological ROS  negative psych ROS   GI/Hepatic negative GI ROS, Neg liver ROS, GERD  ,  Endo/Other  negative endocrine ROSMorbid obesity  Renal/GU negative Renal ROS  negative genitourinary   Musculoskeletal negative musculoskeletal ROS (+) Arthritis , Osteoarthritis,    Abdominal   Peds negative pediatric ROS (+)  Hematology negative hematology ROS (+)   Anesthesia Other Findings   Reproductive/Obstetrics negative OB ROS                             Anesthesia Physical Anesthesia Plan  ASA: III  Anesthesia Plan: General   Post-op Pain Management:    Induction: Intravenous  PONV Risk Score and Plan: 3 and Ondansetron, Dexamethasone and Midazolam  Airway Management Planned: Oral ETT  Additional Equipment:   Intra-op Plan:   Post-operative Plan: Extubation in OR  Informed Consent: I have reviewed the patients History and Physical, chart, labs and discussed the procedure including the risks, benefits and alternatives for the proposed anesthesia with the patient or authorized representative who has indicated his/her understanding and acceptance.   Dental advisory given  Plan Discussed with: CRNA  Anesthesia Plan Comments:         Anesthesia Quick Evaluation

## 2018-03-02 NOTE — Transfer of Care (Signed)
Immediate Anesthesia Transfer of Care Note  Patient: Krista Ferguson  Procedure(s) Performed: LAPAROSCOPIC GASTRIC SLEEVE RESECTION WITH HIATAL HERNIA REPAIR AND UPPER ENDOSCOPY (N/A Abdomen)  Patient Location: PACU  Anesthesia Type:General  Level of Consciousness: awake, alert  and oriented  Airway & Oxygen Therapy: Patient Spontanous Breathing and Patient connected to face mask oxygen  Post-op Assessment: Report given to RN and Post -op Vital signs reviewed and stable  Post vital signs: Reviewed and stable  Last Vitals:  Vitals Value Taken Time  BP 154/90 03/02/2018  1:12 PM  Temp    Pulse 93 03/02/2018  1:13 PM  Resp 18 03/02/2018  1:13 PM  SpO2 99 % 03/02/2018  1:13 PM  Vitals shown include unvalidated device data.  Last Pain:  Vitals:   03/02/18 0940  TempSrc: Oral  PainSc:          Complications: No apparent anesthesia complications

## 2018-03-02 NOTE — Interval H&P Note (Signed)
History and Physical Interval Note:  03/02/2018 10:38 AM  Krista Ferguson [name]  has presented today for surgery, with the diagnosis of MORBID OBESITY  The various methods of treatment have been discussed with the patient and family. After consideration of risks, benefits and other options for treatment, the patient has consented to  Procedure(s): LAPAROSCOPIC GASTRIC SLEEVE RESECTION WITH UPPER ENDO (N/A) as a surgical intervention .  The patient's history has been reviewed, patient examined, no change in status, stable for surgery.  I have reviewed the patient's chart and labs.  Questions were answered to the patient's satisfaction.     Pedro Earls

## 2018-03-02 NOTE — Discharge Instructions (Signed)
° ° ° °GASTRIC BYPASS/SLEEVE ° Home Care Instructions ° ° These instructions are to help you care for yourself when you go home. ° °Call: If you have any problems. °• Call [phone] and ask for the surgeon on call °• If you need immediate help, come to the ER at [facility].  °• Tell the ER staff that you are a new post-op gastric bypass or gastric sleeve patient °  °Signs and symptoms to report: • Severe vomiting or nausea °o If you cannot keep down clear liquids for longer than 1 day, call your surgeon  °• Abdominal pain that does not get better after taking your pain medication °• Fever over 100.4° F with chills °• Heart beating over 100 beats a minute °• Shortness of breath at rest °• Chest pain °•  Redness, swelling, drainage, or foul odor at incision (surgical) sites °•  If your incisions open or pull apart °• Swelling or pain in calf (lower leg) °• Diarrhea (Loose bowel movements that happen often), frequent watery, uncontrolled bowel movements °• Constipation, (no bowel movements for 3 days) if this happens: Pick one °o Milk of Magnesia, 2 tablespoons by mouth, 3 times a day for 2 days if needed °o Stop taking Milk of Magnesia once you have a bowel movement °o Call your doctor if constipation continues °Or °o Miralax  (instead of Milk of Magnesia) following the label instructions °o Stop taking Miralax once you have a bowel movement °o Call your doctor if constipation continues °• Anything you think is not normal °  °Normal side effects after surgery: • Unable to sleep at night or unable to focus °• Irritability or moody °• Being tearful (crying) or depressed °These are common complaints, possibly related to your anesthesia medications that put you to sleep, stress of surgery, and change in lifestyle.  This usually goes away a few weeks after surgery.  If these feelings continue, call your primary care doctor. °  °Wound Care: You may have surgical glue, steri-strips, or staples over your incisions after  surgery °• Surgical glue:  Looks like a clear film over your incisions and will wear off a little at a time °• Steri-strips: Strips of tape over your incisions. You may notice a yellowish color on the skin under the steri-strips. This is used to make the   steri-strips stick better. Do not pull the steri-strips off - let them fall off °• Staples: Staples may be removed before you leave the hospital °o If you go home with staples, call [facility], [phone] at for an appointment with your surgeon’s nurse to have staples removed 10 days after surgery. °• Showering: You may shower two (2) days after your surgery unless your surgeon tells you differently °o Wash gently around incisions with warm soapy water, rinse well, and gently pat dry  °o No tub baths until staples are removed, steri-strips fall off or glue is gone.  °  °Medications: • Medications should be liquid or crushed if larger than the size of a dime °• Extended release pills (medication that release a little bit at a time through the day) should NOT be crushed or cut. (examples include XL, ER, DR, SR) °• Depending on the size and number of medications you take, you may need to space (take a few throughout the day)/change the time you take your medications so that you do not over-fill your pouch (smaller stomach) °• Make sure you follow-up with your primary care doctor to   make medication changes needed during rapid weight loss and life-style changes °• If you have diabetes, follow up with the doctor that orders your diabetes medication(s) within one week after surgery and check your blood sugar regularly. °• Do not drive while taking prescription pain medication  °• It is ok to take Tylenol by the bottle instructions with your pain medicine or instead of your pain medicine as needed.  DO NOT TAKE NSAIDS (EXAMPLES OF NSAIDS:  IBUPROFREN/ NAPROXEN)  °Diet:                    First 2 Weeks ° You will see the dietician t about two (2) weeks  after your surgery. The dietician will increase the types of foods you can eat if you are handling liquids well: °• If you have severe vomiting or nausea and cannot keep down clear liquids lasting longer than 1 day, call your surgeon @ ([phone]) °Protein Shake °• Drink at least 2 ounces of shake 5-6 times per day °• Each serving of protein shakes (usually 8 - 12 ounces) should have: °o 15 grams of protein  °o And no more than 5 grams of carbohydrate  °• Goal for protein each day: °o Men = 80 grams per day °o Women = 60 grams per day °• Protein powder may be added to fluids such as non-fat milk or Lactaid milk or unsweetened Soy/Almond milk (limit to 35 grams added protein powder per serving) ° °Hydration °• Slowly increase the amount of water and other clear liquids as tolerated (See Acceptable Fluids) °• Slowly increase the amount of protein shake as tolerated  °•  Sip fluids slowly and throughout the day.  Do not use straws. °• May use sugar substitutes in small amounts (no more than 6 - 8 packets per day; i.e. Splenda) ° °Fluid Goal °• The first goal is to drink at least 8 ounces of protein shake/drink per day (or as directed by the nutritionist); some examples of protein shakes are Syntrax Nectar, Adkins Advantage, EAS Edge HP, and Unjury. See handout from pre-op Bariatric Education Class: °o Slowly increase the amount of protein shake you drink as tolerated °o You may find it easier to slowly sip shakes throughout the day °o It is important to get your proteins in first °• Your fluid goal is to drink 64 - 100 ounces of fluid daily °o It may take a few weeks to build up to this °• 32 oz (or more) should be clear liquids  °And  °• 32 oz (or more) should be full liquids (see below for examples) °• Liquids should not contain sugar, caffeine, or carbonation ° °Clear Liquids: °• Water or Sugar-free flavored water (i.e. Fruit H2O, Propel) °• Decaffeinated coffee or tea (sugar-free) °• Crystal Lite, Wyler’s Lite,  Minute Maid Lite °• Sugar-free Jell-O °• Bouillon or broth °• Sugar-free Popsicle:   *Less than 20 calories each; Limit 1 per day ° °Full Liquids: °Protein Shakes/Drinks + 2 choices per day of other full liquids °• Full liquids must be: °o No More Than 15 grams of Carbs per serving  °o No More Than 3 grams of Fat per serving °• Strained low-fat cream soup (except Cream of Potato or Tomato) °• Non-Fat milk °• Fat-free Lactaid Milk °• Unsweetened Soy Or Unsweetened Almond Milk °• Low Sugar yogurt (Dannon Lite & Fit, Greek yogurt; Oikos Triple Zero; Chobani Simply 100; Yoplait 100 calorie Greek - No Fruit on the Bottom) ° °  °Vitamins   and Minerals • Start 1 day after surgery unless otherwise directed by your surgeon °• 2 Chewable Bariatric Specific Multivitamin / Multimineral Supplement with iron (Example: Bariatric Advantage Multi EA) °• Chewable Calcium with Vitamin D-3 °(Example: 3 Chewable Calcium Plus 600 with Vitamin D-3) °o Take 500 mg three (3) times a day for a total of 1500 mg each day °o Do not take all 3 doses of calcium at one time as it may cause constipation, and you can only absorb 500 mg  at a time  °o Do not mix multivitamins containing iron with calcium supplements; take 2 hours apart °• Menstruating women and those with a history of anemia (a blood disease that causes weakness) may need extra iron °o Talk with your doctor to see if you need more iron °• Do not stop taking or change any vitamins or minerals until you talk to your dietitian or surgeon °• Your Dietitian and/or surgeon must approve all vitamin and mineral supplements °  °Activity and Exercise: Limit your physical activity as instructed by your doctor.  It is important to continue walking at home.  During this time, use these guidelines: °• Do not lift anything greater than ten (10) pounds for at least two (2) weeks °• Do not go back to work or drive until your surgeon says you can °• You may have sex when you feel comfortable  °o It is  VERY important for female patients to use a reliable birth control method; fertility often increases after surgery  °o All hormonal birth control will be ineffective for 30 days after surgery due to medications given during surgery a barrier method must be used. °o Do not get pregnant for at least 18 months °• Start exercising as soon as your doctor tells you that you can °o Make sure your doctor approves any physical activity °• Start with a simple walking program °• Walk 5-15 minutes each day, 7 days per week.  °• Slowly increase until you are walking 30-45 minutes per day °Consider joining our BELT program. [phone] or email [email or web address] °  °Special Instructions Things to remember: °• Use your CPAP when sleeping if this applies to you ° °• [facility] has two free Bariatric Surgery Support Groups that meet monthly °o The 3rd Thursday of each month, 6 pm, [facility]  °o The 2nd Friday of each month, 11:45 am in the private dining room in the basement of [facility] °• It is very important to keep all follow up appointments with your surgeon, dietitian, primary care physician, and behavioral health practitioner °• Routine follow up schedule with your surgeon include appointments at 2-3 weeks, 6-8 weeks, 6 months, and 1 year at a minimum.  Your surgeon may request to see you more often.   °o After the first year, please follow up with your bariatric surgeon and dietitian at least once a year in order to maintain best weight loss results °[facility]: [phone] °[facility]: [phone] °Bariatric Nurse Coordinator: [phone] °  °   Reviewed and Endorsed  °by  Patient Education Committee, June, 2016 °Edits Approved: Aug, 2018 ° ° ° °

## 2018-03-03 LAB — CBC WITH DIFFERENTIAL/PLATELET
Basophils Absolute: 0 10*3/uL (ref 0.0–0.1)
Basophils Relative: 0 %
EOS ABS: 0 10*3/uL (ref 0.0–0.7)
Eosinophils Relative: 0 %
HEMATOCRIT: 40.4 % (ref 36.0–46.0)
HEMOGLOBIN: 13.4 g/dL (ref 12.0–15.0)
LYMPHS ABS: 1.1 10*3/uL (ref 0.7–4.0)
Lymphocytes Relative: 8 %
MCH: 31.2 pg (ref 26.0–34.0)
MCHC: 33.2 g/dL (ref 30.0–36.0)
MCV: 94.2 fL (ref 78.0–100.0)
MONOS PCT: 5 %
Monocytes Absolute: 0.7 10*3/uL (ref 0.1–1.0)
NEUTROS ABS: 12.8 10*3/uL — AB (ref 1.7–7.7)
NEUTROS PCT: 87 %
Platelets: 358 10*3/uL (ref 150–400)
RBC: 4.29 MIL/uL (ref 3.87–5.11)
RDW: 12.8 % (ref 11.5–15.5)
WBC: 14.7 10*3/uL — AB (ref 4.0–10.5)

## 2018-03-03 NOTE — Progress Notes (Signed)
Discharge instructions discussed with patient,, verbalized agreement and understanding

## 2018-03-03 NOTE — Discharge Summary (Signed)
Physician Discharge Summary  Patient ID: Krista Ferguson MRN: [id number] DOB/AGE: 1975/12/29 42 y.o.  PCP: Cletis Athens, MD  Admit date: 03/02/2018 Discharge date: 03/03/2018  Admission Diagnoses: Morbid obesity and care  Discharge Diagnoses: Same Active Problems:   S/P laparoscopic sleeve gastrectomy   Surgery: Laparoscopic sleeve gastrectomy with hiatal hernia repair posteriorly  Discharged Condition: Improved  Hospital Course:   Patient underwent sleeve gastrectomy on Monday.  She was begun on liquids early and tolerated these well and was advanced drinking adequately and ready for discharge on Tuesday.  Consults: None  Significant Diagnostic Studies: None    Discharge Exam: Blood pressure (!) 142/85, pulse 68, temperature 99 F (37.2 C), temperature source Oral, resp. rate 18, height 5\' 3"  (1.6 m), weight 106.1 kg (234 lb), SpO2 98 %. Incisions bland  Disposition: Discharge disposition: 01-Home or Self Care       Discharge Instructions    Ambulate hourly while awake   Complete by:  As directed    Call MD for:  difficulty breathing, headache or visual disturbances   Complete by:  As directed    Call MD for:  persistant dizziness or light-headedness   Complete by:  As directed    Call MD for:  persistant nausea and vomiting   Complete by:  As directed    Call MD for:  redness, tenderness, or signs of infection (pain, swelling, redness, odor or green/yellow discharge around incision site)   Complete by:  As directed    Call MD for:  severe uncontrolled pain   Complete by:  As directed    Call MD for:  temperature >101 F   Complete by:  As directed    Diet bariatric full liquid   Complete by:  As directed    Incentive spirometry   Complete by:  As directed    Perform hourly while awake     Allergies as of 03/03/2018      Reactions   Levaquin [levofloxacin In D5w] Hives   Sulfa Antibiotics Hives   Sulfur Hives      Medication List    TAKE  these medications   CLARITIN 10 MG tablet Generic drug:  loratadine Take 10 mg by mouth daily.   ENSTILAR 0.005-0.064 % Foam Generic drug:  Calcipotriene-Betameth Diprop Apply 1 application topically daily as needed (for psoriasis).   omeprazole 20 MG tablet Commonly known as:  PRILOSEC OTC Take 20 mg by mouth daily.      Follow-up Information    Surgery, [place]. Go on 03/26/2018.   Specialty:  General Surgery Why:  at 9[address] Contact information: [address] [place] [address] [phone]        Surgery, [place] .   Specialty:  General Surgery Contact information: [address] [place] [address] [phone]           Signed: Pedro Earls 03/03/2018, 1:04 PM

## 2018-03-03 NOTE — Plan of Care (Signed)
Nutrition Education Note  Received consult for diet education per DROP protocol.   Discussed 2 week post op diet with pt. Emphasized that liquids must be non carbonated, non caffeinated, and sugar free. Fluid goals discussed. Pt to follow up with outpatient bariatric RD for further diet progression after 2 weeks. Multivitamins and minerals also reviewed. Teach back method used, pt expressed understanding, expect good compliance.   Diet: First 2 Weeks  You will see the nutritionist about two (2) weeks after your surgery. The nutritionist will increase the types of foods you can eat if you are handling liquids well:  If you have severe vomiting or nausea and cannot handle clear liquids lasting longer than 1 day, call your surgeon  Protein Shake  Drink at least 2 ounces of shake 5-6 times per day  Each serving of protein shakes (usually 8 - 12 ounces) should have a minimum of:  15 grams of protein  And no more than 5 grams of carbohydrate  Goal for protein each day:  Men = 80 grams per day  Women = 60 grams per day  Protein powder may be added to fluids such as non-fat milk or Lactaid milk or Soy milk (limit to 35 grams added protein powder per serving)   Hydration  Slowly increase the amount of water and other clear liquids as tolerated (See Acceptable Fluids)  Slowly increase the amount of protein shake as tolerated  Sip fluids slowly and throughout the day  May use sugar substitutes in small amounts (no more than 6 - 8 packets per day; i.e. Splenda)   Fluid Goal  The first goal is to drink at least 8 ounces of protein shake/drink per day (or as directed by the nutritionist); some examples of protein shakes are Premier Protein, Syntrax Nectar, Adkins Advantage, EAS Edge HP, and Unjury. See handout from pre-op Bariatric Education Class:  Slowly increase the amount of protein shake you drink as tolerated  You may find it easier to slowly sip shakes throughout the day  It is important to  get your proteins in first  Your fluid goal is to drink 64 - 100 ounces of fluid daily  It may take a few weeks to build up to this  32 oz (or more) should be clear liquids  And  32 oz (or more) should be full liquids (see below for examples)  Liquids should not contain sugar, caffeine, or carbonation   Clear Liquids:  Water or Sugar-free flavored water (i.e. Fruit H2O, Propel)  Decaffeinated coffee or tea (sugar-free)  Crystal Lite, Wyler?s Lite, Minute Maid Lite  Sugar-free Jell-O  Bouillon or broth  Sugar-free Popsicle: *Less than 20 calories each; Limit 1 per day   Full Liquids:  Protein Shakes/Drinks + 2 choices per day of other full liquids  Full liquids must be:  No More Than 12 grams of Carbs per serving  No More Than 3 grams of Fat per serving  Strained low-fat cream soup  Non-Fat milk  Fat-free Lactaid Milk  Sugar-free yogurt (Dannon Lite & Fit, Greek yogurt, Oikos Zero)   Jerusalem Brownstein RD, LDN Clinical Nutrition Pager # - [phone]   

## 2018-03-03 NOTE — Progress Notes (Signed)
Patient alert and oriented, pain is controlled. Patient is tolerating fluids, advanced to protein shake today, patient is tolerating well.  Reviewed Gastric sleeve discharge instructions with patient and patient is able to articulate understanding.  Provided information on BELT program, Support Group and [facility] outpatient pharmacy. All questions answered, will continue to monitor.  

## 2018-03-09 ENCOUNTER — Telehealth (HOSPITAL_COMMUNITY): Payer: Self-pay

## 2018-03-09 NOTE — Telephone Encounter (Signed)
Patient called to discuss post bariatric surgery follow up questions.  See below:   1.  Tell me about your pain and pain management?not having pain medication  2.  Let's talk about fluid intake.  How much total fluid are you taking in?60 ounces of fluid  3.  How much protein have you taken in the last 2 days?60 grams  4.  Have you had nausea?  Tell me about when have experienced nausea and what you did to help?no nausea  5.  Has the frequency or color changed with your urine?urinating frequently light in color  6.  Tell me what your incisions look like?no problems with incisions  7.  Have you been passing gas? BM?passing gas had bm  8.  If a problem or question were to arise who would you call?  Do you know contact numbers for [facility], [facility], and [facility]?contact numbers provided to patient for services  9.  How has the walking going?ambulating hourly  10.  How are your vitamins and calcium going?  How are you taking them?taking multivitamins and calcium as recommended

## 2018-03-17 ENCOUNTER — Encounter: Payer: BLUE CROSS/BLUE SHIELD | Admitting: Registered"

## 2018-03-17 DIAGNOSIS — Z800 Family history of malignant neoplasm of digestive organs: Secondary | ICD-10-CM | POA: Diagnosis not present

## 2018-03-17 DIAGNOSIS — Z9889 Other specified postprocedural states: Secondary | ICD-10-CM | POA: Diagnosis not present

## 2018-03-17 DIAGNOSIS — E7800 Pure hypercholesterolemia, unspecified: Secondary | ICD-10-CM | POA: Diagnosis not present

## 2018-03-17 DIAGNOSIS — Z79899 Other long term (current) drug therapy: Secondary | ICD-10-CM | POA: Diagnosis not present

## 2018-03-17 DIAGNOSIS — F419 Anxiety disorder, unspecified: Secondary | ICD-10-CM | POA: Diagnosis not present

## 2018-03-17 DIAGNOSIS — Z823 Family history of stroke: Secondary | ICD-10-CM | POA: Diagnosis not present

## 2018-03-17 DIAGNOSIS — Z8041 Family history of malignant neoplasm of ovary: Secondary | ICD-10-CM | POA: Diagnosis not present

## 2018-03-17 DIAGNOSIS — Z8042 Family history of malignant neoplasm of prostate: Secondary | ICD-10-CM | POA: Diagnosis not present

## 2018-03-17 DIAGNOSIS — Z713 Dietary counseling and surveillance: Secondary | ICD-10-CM | POA: Diagnosis not present

## 2018-03-17 DIAGNOSIS — Z836 Family history of other diseases of the respiratory system: Secondary | ICD-10-CM | POA: Diagnosis not present

## 2018-03-17 DIAGNOSIS — Z8249 Family history of ischemic heart disease and other diseases of the circulatory system: Secondary | ICD-10-CM | POA: Diagnosis not present

## 2018-03-17 DIAGNOSIS — Z833 Family history of diabetes mellitus: Secondary | ICD-10-CM | POA: Diagnosis not present

## 2018-03-17 DIAGNOSIS — Z8261 Family history of arthritis: Secondary | ICD-10-CM | POA: Diagnosis not present

## 2018-03-17 DIAGNOSIS — E669 Obesity, unspecified: Secondary | ICD-10-CM

## 2018-03-17 DIAGNOSIS — Z90710 Acquired absence of both cervix and uterus: Secondary | ICD-10-CM | POA: Diagnosis not present

## 2018-03-17 DIAGNOSIS — Z8541 Personal history of malignant neoplasm of cervix uteri: Secondary | ICD-10-CM | POA: Diagnosis not present

## 2018-03-17 DIAGNOSIS — Z881 Allergy status to other antibiotic agents status: Secondary | ICD-10-CM | POA: Diagnosis not present

## 2018-03-17 DIAGNOSIS — M1990 Unspecified osteoarthritis, unspecified site: Secondary | ICD-10-CM | POA: Diagnosis not present

## 2018-03-17 DIAGNOSIS — Z882 Allergy status to sulfonamides status: Secondary | ICD-10-CM | POA: Diagnosis not present

## 2018-03-17 DIAGNOSIS — Z803 Family history of malignant neoplasm of breast: Secondary | ICD-10-CM | POA: Diagnosis not present

## 2018-03-24 ENCOUNTER — Telehealth: Payer: Self-pay | Admitting: Registered"

## 2018-03-24 NOTE — Telephone Encounter (Signed)
RD called pt to verify fluid intake once restarting soft, solid proteins 2 week post-bariatric surgery.   Daily Fluid intake:50-60 ounces/day Daily Protein intake: 60 grams/day  Concerns/issues: none stated

## 2018-04-07 DIAGNOSIS — G5602 Carpal tunnel syndrome, left upper limb: Secondary | ICD-10-CM | POA: Insufficient documentation

## 2018-04-28 ENCOUNTER — Encounter: Payer: BLUE CROSS/BLUE SHIELD | Attending: Surgery | Admitting: Skilled Nursing Facility1

## 2018-04-28 ENCOUNTER — Encounter: Payer: Self-pay | Admitting: Skilled Nursing Facility1

## 2018-04-28 DIAGNOSIS — Z90710 Acquired absence of both cervix and uterus: Secondary | ICD-10-CM | POA: Insufficient documentation

## 2018-04-28 DIAGNOSIS — E7800 Pure hypercholesterolemia, unspecified: Secondary | ICD-10-CM | POA: Insufficient documentation

## 2018-04-28 DIAGNOSIS — Z882 Allergy status to sulfonamides status: Secondary | ICD-10-CM | POA: Diagnosis not present

## 2018-04-28 DIAGNOSIS — Z79899 Other long term (current) drug therapy: Secondary | ICD-10-CM | POA: Diagnosis not present

## 2018-04-28 DIAGNOSIS — Z713 Dietary counseling and surveillance: Secondary | ICD-10-CM | POA: Insufficient documentation

## 2018-04-28 DIAGNOSIS — Z8261 Family history of arthritis: Secondary | ICD-10-CM | POA: Diagnosis not present

## 2018-04-28 DIAGNOSIS — Z8249 Family history of ischemic heart disease and other diseases of the circulatory system: Secondary | ICD-10-CM | POA: Insufficient documentation

## 2018-04-28 DIAGNOSIS — E669 Obesity, unspecified: Secondary | ICD-10-CM

## 2018-04-28 DIAGNOSIS — Z8541 Personal history of malignant neoplasm of cervix uteri: Secondary | ICD-10-CM | POA: Insufficient documentation

## 2018-04-28 DIAGNOSIS — Z803 Family history of malignant neoplasm of breast: Secondary | ICD-10-CM | POA: Insufficient documentation

## 2018-04-28 DIAGNOSIS — Z833 Family history of diabetes mellitus: Secondary | ICD-10-CM | POA: Insufficient documentation

## 2018-04-28 DIAGNOSIS — Z836 Family history of other diseases of the respiratory system: Secondary | ICD-10-CM | POA: Diagnosis not present

## 2018-04-28 DIAGNOSIS — Z8042 Family history of malignant neoplasm of prostate: Secondary | ICD-10-CM | POA: Diagnosis not present

## 2018-04-28 DIAGNOSIS — Z823 Family history of stroke: Secondary | ICD-10-CM | POA: Insufficient documentation

## 2018-04-28 DIAGNOSIS — Z8041 Family history of malignant neoplasm of ovary: Secondary | ICD-10-CM | POA: Diagnosis not present

## 2018-04-28 DIAGNOSIS — M1990 Unspecified osteoarthritis, unspecified site: Secondary | ICD-10-CM | POA: Insufficient documentation

## 2018-04-28 DIAGNOSIS — Z9889 Other specified postprocedural states: Secondary | ICD-10-CM | POA: Insufficient documentation

## 2018-04-28 DIAGNOSIS — F419 Anxiety disorder, unspecified: Secondary | ICD-10-CM | POA: Insufficient documentation

## 2018-04-28 DIAGNOSIS — Z881 Allergy status to other antibiotic agents status: Secondary | ICD-10-CM | POA: Diagnosis not present

## 2018-04-28 DIAGNOSIS — Z800 Family history of malignant neoplasm of digestive organs: Secondary | ICD-10-CM | POA: Insufficient documentation

## 2018-04-28 NOTE — Progress Notes (Signed)
Follow-up visit:  6 Weeks Post-Operative Sleeve Surgery  Primary concerns today: Post-operative Bariatric Surgery Nutrition Management.  Pt states she struggled finding foods she could hold down until 2 weeks ago. Pt states due to a busy lifestyle she goes out to dinner 4-7 days a week. 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 Pt states she still has some reflux. Pt states her reflux has gotten better since surgery but still causing nausea. Pt states she accidentally drank a sprite and it caused a lot of pain. Pt states she cannot tolerate the smell of eggs. Pt states she likes soy options and does not like animal products. Pt staet she has been feeling tired after her workouts.   TANITA  BODY COMP RESULTS  04/28/2018   BMI (kg/m^2) 35.9   Fat Mass (lbs) 96.4   Fat Free Mass (lbs) 106.4   Total Body Water (lbs) 77    Surgery date: 03/02/2018 Surgery type: sleeve Start weight at [facility]: 230.5 Weight today: 202.8  24-hr recall: B (AM): yogurt Snk (AM):  L (PM): grilled chicken Snk (PM): cheesits  D (PM): beef patty with cheese or tacos or deli meat Snk (PM):   Fluid intake: water, decaff tea: 50-60 ounces Estimated total protein intake: 60+  Medications: see list  Supplementation: bariatric advantage and calcium   Using straws: no Drinking while eating: no Having you been chewing well:no Chewing/swallowing difficulties: no Changes in vision: no Changes to mood/headaches: no Hair loss/Cahnges to skin/Changes to nails: no Any difficulty focusing or concentrating: no Sweating: no Dizziness/Lightheaded: no Palpitations: no  Carbonated beverages: no N/V/D/C/GAS: one every 3 days  Abdominal Pain: no Dumping syndrome: no  Recent physical activity:  taking the stairs and parking further away and stand up desk, swimming pool  Progress Towards Goal(s):  In progress.    Intervention:  Nutrition counseling. Goals: -Try alkaline water pH 8.8 -Try  kefir in the yogurt aisle  -Continue to chew until applesauce consistency  -Continue to not drink with meals  -Start with well cooked vegetables for 2 days then add raw as you like -Use fruits with breakfast or as a snack   Teaching Method Utilized:  Visual Auditory Hands on  Barriers to learning/adherence to lifestyle change: none identified   Demonstrated degree of understanding via:  Teach Back   Monitoring/Evaluation:  Dietary intake, exercise, and body weight.

## 2018-04-28 NOTE — Patient Instructions (Addendum)
-  Try alkaline water pH 8.8  -Try kefir in the yogurt aisle   -Continue to chew until applesauce consistency   -Continue to not drink with meals   -Start with well cooked vegetables for 2 days then add raw as you like  -Use fruits with breakfast or as a snack

## 2018-04-29 NOTE — Progress Notes (Signed)
Bariatric Class:  Appt start time: 1530 end time:  1630.  2 Week Post-Operative Nutrition Class  Patient was seen on 03/17/2018 for [facility]ative [facility]   Surgery date: 03/02/2018 Surgery type: Sleeve Start weight at [facility]: 230.5 Weight today: provider forgot to document Weight change: N/A  The following the learning objectives were met by the patient during this course:  Identifies Phase 3A (Soft, High Proteins) Dietary Goals and will begin from 2 weeks post-operatively to 2 months post-operatively  Identifies appropriate sources of fluids and proteins   States protein recommendations and appropriate sources post-operatively  Identifies the need for appropriate texture modifications, mastication, and bite sizes when consuming solids  Identifies appropriate multivitamin and calcium sources post-operatively  Describes the need for physical activity post-operatively and will follow MD recommendations  States when to call healthcare provider regarding medication questions or post-operative complications  Handouts given during class include:  Phase 3A: Soft, High Protein Diet Handout  Follow-Up Plan: Patient will follow-up at [facility] in 6 weeks for 2 month post-op nutrition visit for diet advancement per MD.

## 2018-06-29 ENCOUNTER — Encounter: Payer: BLUE CROSS/BLUE SHIELD | Attending: Surgery | Admitting: Skilled Nursing Facility1

## 2018-06-29 ENCOUNTER — Encounter: Payer: Self-pay | Admitting: Skilled Nursing Facility1

## 2018-06-29 DIAGNOSIS — Z8042 Family history of malignant neoplasm of prostate: Secondary | ICD-10-CM | POA: Insufficient documentation

## 2018-06-29 DIAGNOSIS — F419 Anxiety disorder, unspecified: Secondary | ICD-10-CM | POA: Insufficient documentation

## 2018-06-29 DIAGNOSIS — Z833 Family history of diabetes mellitus: Secondary | ICD-10-CM | POA: Insufficient documentation

## 2018-06-29 DIAGNOSIS — Z9889 Other specified postprocedural states: Secondary | ICD-10-CM | POA: Diagnosis not present

## 2018-06-29 DIAGNOSIS — E669 Obesity, unspecified: Secondary | ICD-10-CM

## 2018-06-29 DIAGNOSIS — Z8249 Family history of ischemic heart disease and other diseases of the circulatory system: Secondary | ICD-10-CM | POA: Diagnosis not present

## 2018-06-29 DIAGNOSIS — Z8541 Personal history of malignant neoplasm of cervix uteri: Secondary | ICD-10-CM | POA: Diagnosis not present

## 2018-06-29 DIAGNOSIS — Z800 Family history of malignant neoplasm of digestive organs: Secondary | ICD-10-CM | POA: Insufficient documentation

## 2018-06-29 DIAGNOSIS — Z8041 Family history of malignant neoplasm of ovary: Secondary | ICD-10-CM | POA: Insufficient documentation

## 2018-06-29 DIAGNOSIS — M1990 Unspecified osteoarthritis, unspecified site: Secondary | ICD-10-CM | POA: Diagnosis not present

## 2018-06-29 DIAGNOSIS — Z803 Family history of malignant neoplasm of breast: Secondary | ICD-10-CM | POA: Diagnosis not present

## 2018-06-29 DIAGNOSIS — Z90710 Acquired absence of both cervix and uterus: Secondary | ICD-10-CM | POA: Diagnosis not present

## 2018-06-29 DIAGNOSIS — Z881 Allergy status to other antibiotic agents status: Secondary | ICD-10-CM | POA: Insufficient documentation

## 2018-06-29 DIAGNOSIS — Z836 Family history of other diseases of the respiratory system: Secondary | ICD-10-CM | POA: Insufficient documentation

## 2018-06-29 DIAGNOSIS — E7800 Pure hypercholesterolemia, unspecified: Secondary | ICD-10-CM | POA: Insufficient documentation

## 2018-06-29 DIAGNOSIS — Z823 Family history of stroke: Secondary | ICD-10-CM | POA: Diagnosis not present

## 2018-06-29 DIAGNOSIS — Z8261 Family history of arthritis: Secondary | ICD-10-CM | POA: Diagnosis not present

## 2018-06-29 DIAGNOSIS — Z713 Dietary counseling and surveillance: Secondary | ICD-10-CM | POA: Insufficient documentation

## 2018-06-29 DIAGNOSIS — Z79899 Other long term (current) drug therapy: Secondary | ICD-10-CM | POA: Insufficient documentation

## 2018-06-29 DIAGNOSIS — Z882 Allergy status to sulfonamides status: Secondary | ICD-10-CM | POA: Diagnosis not present

## 2018-06-29 NOTE — Progress Notes (Signed)
Post-Operative Sleeve Surgery  Primary concerns today: Post-operative Bariatric Surgery Nutrition Management.   Pt states her bowel movements have been off due to anxiety. Pt states she has had the same reflux controlled with OTC acid reducer as needed not every day. Pt states she has functional issues with watery fruits and a weird feeling in her stomach. Pt states food smells no longer cause nausea and can now eat eggs again. Pt states oatmeal feels heavy. Pt states the protein coffee keeps her bowel movement regular.    TANITA  BODY COMP RESULTS  04/28/2018   BMI (kg/m^2) 35.9   Fat Mass (lbs) 96.4   Fat Free Mass (lbs) 106.4   Total Body Water (lbs) 77    Surgery date: 03/02/2018 Surgery type: sleeve Start weight at [facility]: 230.5 Weight today: 188.6 Weight change: 14.2  24-hr recall: 5 days a week 3 meals; 7 days a week 1-2 snacks every every 2-3 hours; 2-3 times a day 7 days a week eating vegetables  B (AM): protein coffee or eggs Snk (AM): fruit or veggies L (PM): leftovers (steak or chicken) and asparagus or broccoli  Snk (PM): fruit or veggie  D (PM): beef patty with cheese or tacos or deli meat Snk (PM):   Fluid intake: water, decaff tea, protein coffee: 40-50 ounces Estimated total protein intake: 60+  Medications: see list  Supplementation: bariatric advantage chewable and calcium   Using straws: no Drinking while eating: no Having you been chewing well:no Chewing/swallowing difficulties: no Changes in vision: no Changes to mood/headaches: no Hair loss/Cahnges to skin/Changes to nails: no Any difficulty focusing or concentrating: no Sweating: no Dizziness/Lightheaded: no Palpitations: no  Carbonated beverages: no N/V/D/C/GAS: no Abdominal Pain: no Dumping syndrome: no  Recent physical activity:  taking the stairs and parking further away and stand up desk, swimming pool: weather depending minimum of 2 days, walking Monday thru Friday    Progress Towards  Goal(s):  In progress.    Intervention:  Nutrition counseling. Goals: -Keep up the great work! -chew until applesauce consistency -have non-starchy vegetables 2 times a day 7 days a week -continue to control your portion sizes  Teaching Method Utilized:  Visual Auditory Hands on  Barriers to learning/adherence to lifestyle change: none identified   Demonstrated degree of understanding via:  Teach Back   Monitoring/Evaluation:  Dietary intake, exercise, and body weight.

## 2018-09-03 ENCOUNTER — Encounter: Payer: Self-pay | Admitting: Skilled Nursing Facility1

## 2018-09-03 ENCOUNTER — Encounter: Payer: BLUE CROSS/BLUE SHIELD | Attending: Surgery | Admitting: Skilled Nursing Facility1

## 2018-09-03 DIAGNOSIS — Z836 Family history of other diseases of the respiratory system: Secondary | ICD-10-CM | POA: Insufficient documentation

## 2018-09-03 DIAGNOSIS — Z9889 Other specified postprocedural states: Secondary | ICD-10-CM | POA: Diagnosis not present

## 2018-09-03 DIAGNOSIS — Z90710 Acquired absence of both cervix and uterus: Secondary | ICD-10-CM | POA: Insufficient documentation

## 2018-09-03 DIAGNOSIS — Z8249 Family history of ischemic heart disease and other diseases of the circulatory system: Secondary | ICD-10-CM | POA: Insufficient documentation

## 2018-09-03 DIAGNOSIS — E7800 Pure hypercholesterolemia, unspecified: Secondary | ICD-10-CM | POA: Insufficient documentation

## 2018-09-03 DIAGNOSIS — Z8042 Family history of malignant neoplasm of prostate: Secondary | ICD-10-CM | POA: Diagnosis not present

## 2018-09-03 DIAGNOSIS — Z803 Family history of malignant neoplasm of breast: Secondary | ICD-10-CM | POA: Diagnosis not present

## 2018-09-03 DIAGNOSIS — Z823 Family history of stroke: Secondary | ICD-10-CM | POA: Insufficient documentation

## 2018-09-03 DIAGNOSIS — Z882 Allergy status to sulfonamides status: Secondary | ICD-10-CM | POA: Insufficient documentation

## 2018-09-03 DIAGNOSIS — Z881 Allergy status to other antibiotic agents status: Secondary | ICD-10-CM | POA: Insufficient documentation

## 2018-09-03 DIAGNOSIS — Z833 Family history of diabetes mellitus: Secondary | ICD-10-CM | POA: Insufficient documentation

## 2018-09-03 DIAGNOSIS — Z800 Family history of malignant neoplasm of digestive organs: Secondary | ICD-10-CM | POA: Insufficient documentation

## 2018-09-03 DIAGNOSIS — Z79899 Other long term (current) drug therapy: Secondary | ICD-10-CM | POA: Insufficient documentation

## 2018-09-03 DIAGNOSIS — Z713 Dietary counseling and surveillance: Secondary | ICD-10-CM | POA: Diagnosis present

## 2018-09-03 DIAGNOSIS — Z8261 Family history of arthritis: Secondary | ICD-10-CM | POA: Diagnosis not present

## 2018-09-03 DIAGNOSIS — Z8541 Personal history of malignant neoplasm of cervix uteri: Secondary | ICD-10-CM | POA: Diagnosis not present

## 2018-09-03 DIAGNOSIS — Z8041 Family history of malignant neoplasm of ovary: Secondary | ICD-10-CM | POA: Insufficient documentation

## 2018-09-03 DIAGNOSIS — F419 Anxiety disorder, unspecified: Secondary | ICD-10-CM | POA: Insufficient documentation

## 2018-09-03 DIAGNOSIS — M1990 Unspecified osteoarthritis, unspecified site: Secondary | ICD-10-CM | POA: Insufficient documentation

## 2018-09-03 DIAGNOSIS — E669 Obesity, unspecified: Secondary | ICD-10-CM

## 2018-09-03 NOTE — Patient Instructions (Addendum)
-  Keep working on cooking more meals from home  -Log everything you eat and drink into MyFitness Pal or just hand writing

## 2018-09-03 NOTE — Progress Notes (Signed)
Post-Operative Sleeve Surgery  Primary concerns today: Post-operative Bariatric Surgery Nutrition Management.   Pt states she rarely has nausea and still has reflux (a couple times a week) with broccoli and seasoning such as pepper. Pt states she does not cook her meals and buys her meals out. Pt states her arthritis keeps her from doing weight bearing exercises. Pt states he cannot swallow capsules.  Pt seems to have a sensitive gut with reactions to different foods and how they are made.   TANITA  BODY COMP RESULTS  04/28/2018 09/03/2018   BMI (kg/m^2) 35.9 31.9   Fat Mass (lbs) 96.4 75   Fat Free Mass (lbs) 106.4 105.2   Total Body Water (lbs) 77 75.2    Surgery date: 03/02/2018 Surgery type: sleeve Start weight at [facility]: 230.5 Weight today: 180.2 Weight change: 8.6  24-hr recall: 5 days a week 3 meals; 7 days a week 1-2 snacks every every 2-3 hours; 2-3 times a day 7 days a week eating vegetables  B (AM): protein coffee or bacon and egg Snk (AM): fruit or p3 L (PM): leftovers (steak or chicken) and asparagus or broccoli or grilled chicken nugget and fruit or frozen meal Snk (PM): fruit or p3 D (PM): beef patty with cheese or tacos or deli meat Snk (PM):   Fluid intake: water, decaff tea, protein water, 3 days a week protein coffee: 65-75 ounces Estimated total protein intake: 60+  Medications: see list  Supplementation: procare and calcium   Using straws: no Drinking while eating: no Having you been chewing well: yes Chewing/swallowing difficulties: no Changes in vision: no Changes to mood/headaches: no Hair loss/Cahnges to skin/Changes to nails: no Any difficulty focusing or concentrating: no Sweating: no Dizziness/Lightheaded: no Palpitations: no  Carbonated beverages: no N/V/D/C/GAS: every other day having a bowel movement  Abdominal Pain: no Dumping syndrome: no  Recent physical activity:  4 days a week walking 30 mintues  Progress Towards Goal(s):  In  progress.    Intervention:  Nutrition counseling. Goals: -Keep up the great work! -Keep working on cooking more meals from home -Log everything you eat and drink into MyFitness Pal or just hand writing   Teaching Method Utilized:  Visual Auditory Hands on  Barriers to learning/adherence to lifestyle change: none identified   Demonstrated degree of understanding via:  Teach Back   Monitoring/Evaluation:  Dietary intake, exercise, and body weight.

## 2018-12-08 ENCOUNTER — Encounter: Payer: Self-pay | Admitting: Skilled Nursing Facility1

## 2018-12-08 ENCOUNTER — Encounter: Payer: BLUE CROSS/BLUE SHIELD | Attending: Surgery | Admitting: Skilled Nursing Facility1

## 2018-12-08 DIAGNOSIS — F419 Anxiety disorder, unspecified: Secondary | ICD-10-CM | POA: Diagnosis not present

## 2018-12-08 DIAGNOSIS — Z836 Family history of other diseases of the respiratory system: Secondary | ICD-10-CM | POA: Diagnosis not present

## 2018-12-08 DIAGNOSIS — Z882 Allergy status to sulfonamides status: Secondary | ICD-10-CM | POA: Diagnosis not present

## 2018-12-08 DIAGNOSIS — Z9889 Other specified postprocedural states: Secondary | ICD-10-CM | POA: Diagnosis not present

## 2018-12-08 DIAGNOSIS — Z881 Allergy status to other antibiotic agents status: Secondary | ICD-10-CM | POA: Diagnosis not present

## 2018-12-08 DIAGNOSIS — Z79899 Other long term (current) drug therapy: Secondary | ICD-10-CM | POA: Insufficient documentation

## 2018-12-08 DIAGNOSIS — M1990 Unspecified osteoarthritis, unspecified site: Secondary | ICD-10-CM | POA: Diagnosis not present

## 2018-12-08 DIAGNOSIS — E7800 Pure hypercholesterolemia, unspecified: Secondary | ICD-10-CM | POA: Insufficient documentation

## 2018-12-08 DIAGNOSIS — Z8249 Family history of ischemic heart disease and other diseases of the circulatory system: Secondary | ICD-10-CM | POA: Diagnosis not present

## 2018-12-08 DIAGNOSIS — Z8042 Family history of malignant neoplasm of prostate: Secondary | ICD-10-CM | POA: Diagnosis not present

## 2018-12-08 DIAGNOSIS — Z800 Family history of malignant neoplasm of digestive organs: Secondary | ICD-10-CM | POA: Insufficient documentation

## 2018-12-08 DIAGNOSIS — Z833 Family history of diabetes mellitus: Secondary | ICD-10-CM | POA: Diagnosis not present

## 2018-12-08 DIAGNOSIS — Z803 Family history of malignant neoplasm of breast: Secondary | ICD-10-CM | POA: Diagnosis not present

## 2018-12-08 DIAGNOSIS — Z823 Family history of stroke: Secondary | ICD-10-CM | POA: Diagnosis not present

## 2018-12-08 DIAGNOSIS — Z8041 Family history of malignant neoplasm of ovary: Secondary | ICD-10-CM | POA: Insufficient documentation

## 2018-12-08 DIAGNOSIS — Z713 Dietary counseling and surveillance: Secondary | ICD-10-CM | POA: Diagnosis present

## 2018-12-08 DIAGNOSIS — Z8541 Personal history of malignant neoplasm of cervix uteri: Secondary | ICD-10-CM | POA: Insufficient documentation

## 2018-12-08 DIAGNOSIS — Z8261 Family history of arthritis: Secondary | ICD-10-CM | POA: Diagnosis not present

## 2018-12-08 DIAGNOSIS — Z90710 Acquired absence of both cervix and uterus: Secondary | ICD-10-CM | POA: Insufficient documentation

## 2018-12-08 NOTE — Progress Notes (Signed)
Post-Operative Sleeve Surgery  Primary concerns today: Post-operative Bariatric Surgery Nutrition Management. Pt seems to have a sensitive gut with reactions to different foods and how they are made.   Pt states she has found with fruit she gets acid reflux usually juicier fruits so banana is okay. Pt states she take capsule they hurt her stomachs. Pt states she has always struggled with finding supplements that are absorbed stating she finally found a routine that works for her. Pt states she has been cooking 4-5 meals a week instead of 100% of her meals out realizing her stomach cannot handle eating out. Pt states she still has trouble with ice burg lettuce and cabbage. Pt states she eats energy sources about several times a week.  Pt states she does have hypersomnia.    TANITA  BODY COMP RESULTS  04/28/2018 09/03/2018 12/08/2018   BMI (kg/m^2) 35.9 31.9 32.2   Fat Mass (lbs) 96.4 75 73.2   Fat Free Mass (lbs) 106.4 105.2 103   Total Body Water (lbs) 77 75.2 73.2   Surgery date: 03/02/2018 Surgery type: sleeve Start weight at [facility]: 230.5 Weight today: 176.2 Weight change: 4  24-hr recall: 5 days a week 3 meals; 7 days a week 1-2 snacks every 2-3 hours; 2-3 times a day 7 days a week eating vegetables  B (AM): protein coffee or baconsausage and egg sometimes with crackers Snk (AM): p3 L (PM): grilled shrimp and broccoli Snk (PM): p3 D (PM): beef patty with cheese or tacos or deli meat Snk (PM):   Fluid intake: water, decaff tea, protein water: 60-80 oz Estimated total protein intake: 60+  Medications: see list  Supplementation: multi liquid once a week, multi chewable procare, and calcium   Using straws: no Drinking while eating: no Having you been chewing well: yes Chewing/swallowing difficulties: no Changes in vision: no Changes to mood/headaches: no Hair loss/Changes to skin/Changes to nails: no Any difficulty focusing or concentrating: no Sweating:  no Dizziness/Lightheaded: no Palpitations: no  Carbonated beverages: no N/V/D/C/GAS: every day with a stool softner  Abdominal Pain: no Dumping syndrome: no  Recent physical activity:  4 days a week walking 30 minutes; taking the stairs at work  Progress Towards Goal(s):  In progress.    Intervention:  Nutrition counseling. Goals: -Keep up the great work! -Keep working on cooking more meals from home -Log everything you eat and drink into MyFitness Pal or just hand writing  -Work on increases resistance exercises  -Increase the speed on your elliptical  Teaching Method Utilized:  Visual Auditory Hands on  Barriers to learning/adherence to lifestyle change: none identified   Demonstrated degree of understanding via:  Teach Back   Monitoring/Evaluation:  Dietary intake, exercise, and body weight. Next visit 1 year out.

## 2019-01-18 IMAGING — RF DG UGI W/ HIGH DENSITY W/KUB
10 of 15 series · 13 of 23 positions shown · non-contrast
Comparison: None.

CLINICAL DATA: Preop testing for gastric sleeve procedure. No
complaints.

EXAM:
UPPER GI SERIES WITH KUB
TECHNIQUE: After obtaining a scout radiograph a routine upper GI series was
performed using thin and high density barium.
FLUOROSCOPY TIME:  Fluoroscopy Time:  0.7 minutes
Radiation Exposure Index (if provided by the fluoroscopic device):
24.6 mGy. This includes the KUB.
Number of Acquired Spot Images: 0

[Series 1: t abdomen supine · 0.15mm/px · 1 of 1 slices shown]
[im 1/1]
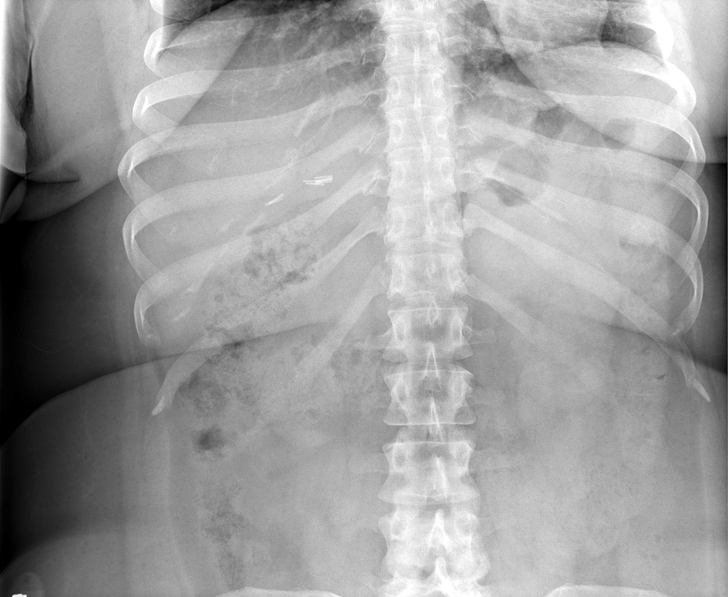

[Series 3: cp_standard · 0.26mm/px · 1 of 1 slices shown (1 of 9)]
[im 1/1]
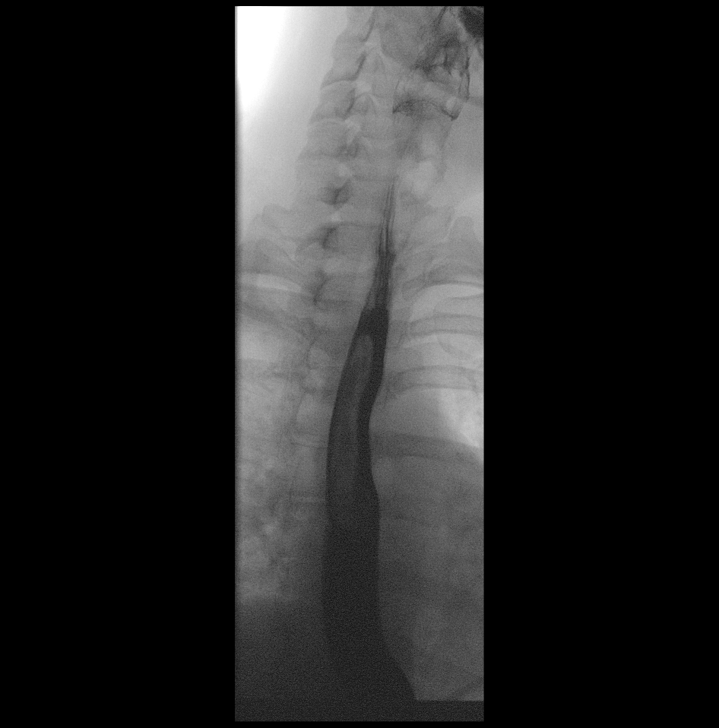

[Series 4: cp_standard · 0.52mm/px · 1 of 5 frames shown (2 of 9)]
[frame 3/5]
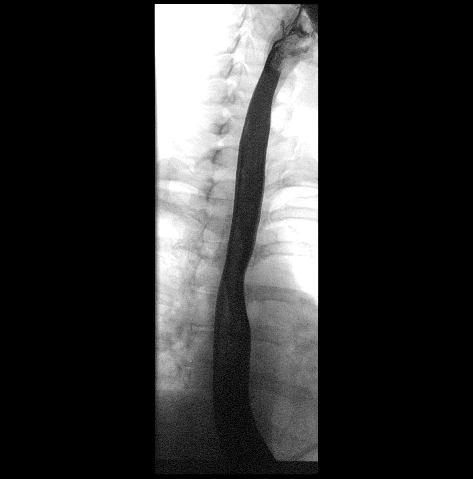

[Series 5: cp_standard · 0.52mm/px · 3 of 25 frames shown (3 of 9)]
[frame 1/25]
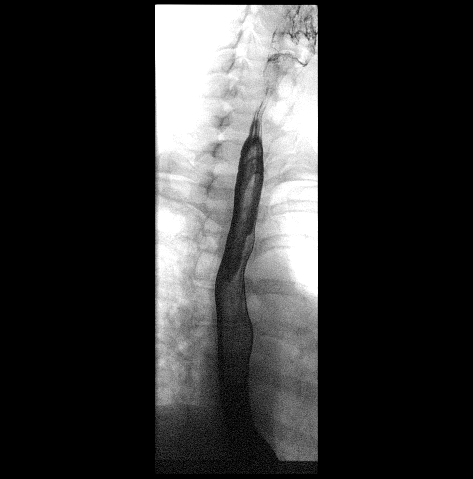
[frame 4/25]
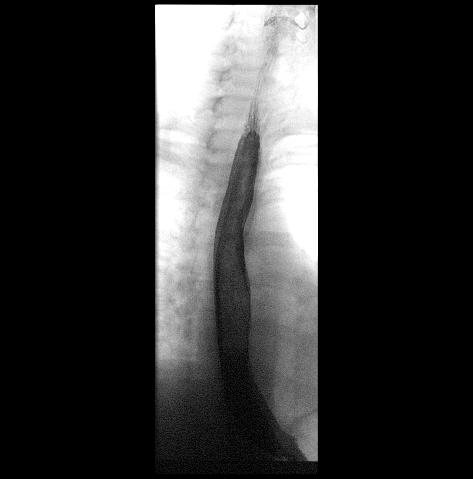
[frame 22/25]
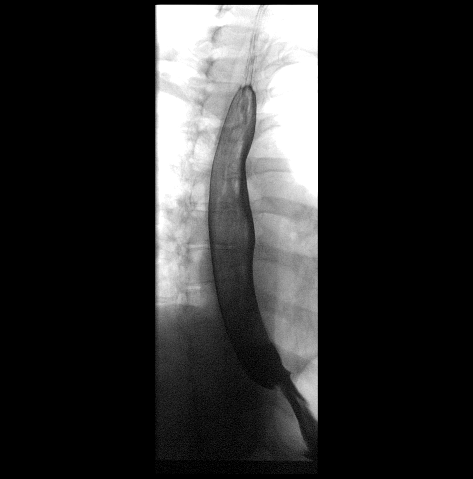

[Series 7: cp_standard · 0.27mm/px · 1 of 1 slices shown (4 of 9)]
[im 1/1]
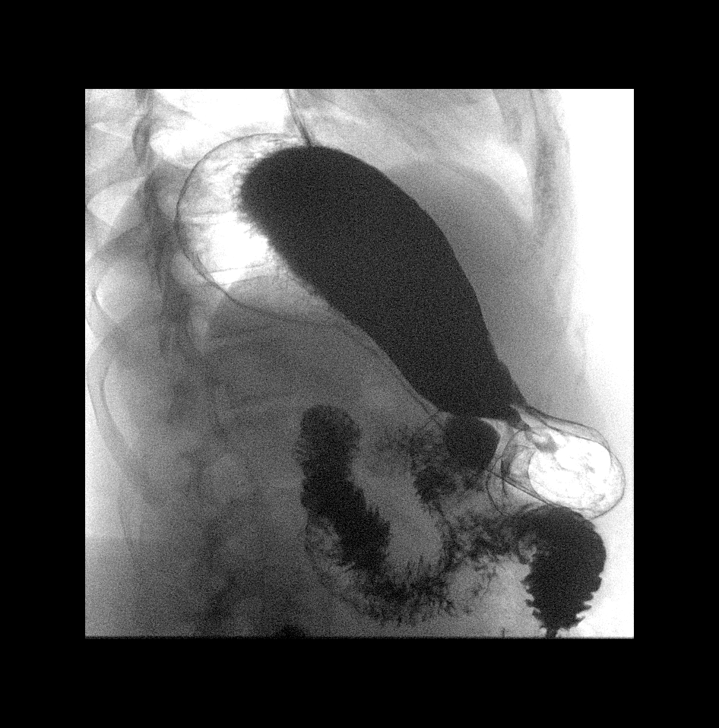

[Series 9: cp_standard · 0.28mm/px · 1 of 1 slices shown (5 of 9)]
[im 1/1]
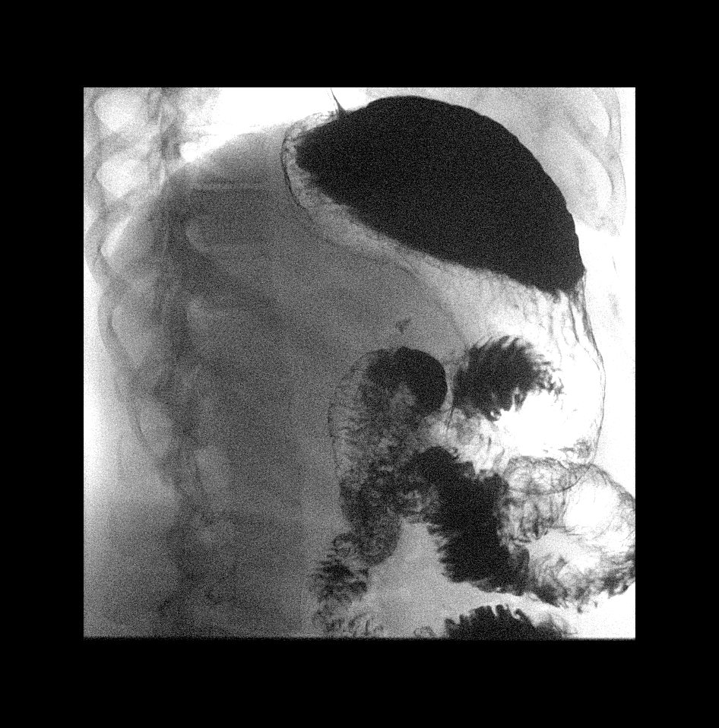

[Series 11: cp_standard · 0.28mm/px · 1 of 1 slices shown (6 of 9)]
[im 1/1]
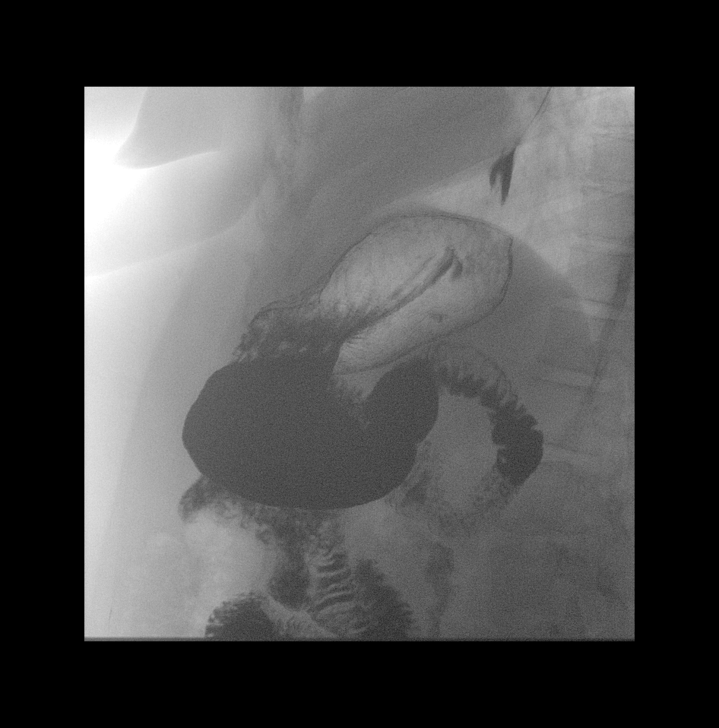

[Series 12: cp_standard · 0.28mm/px · 1 of 1 slices shown (7 of 9)]
[im 1/1]
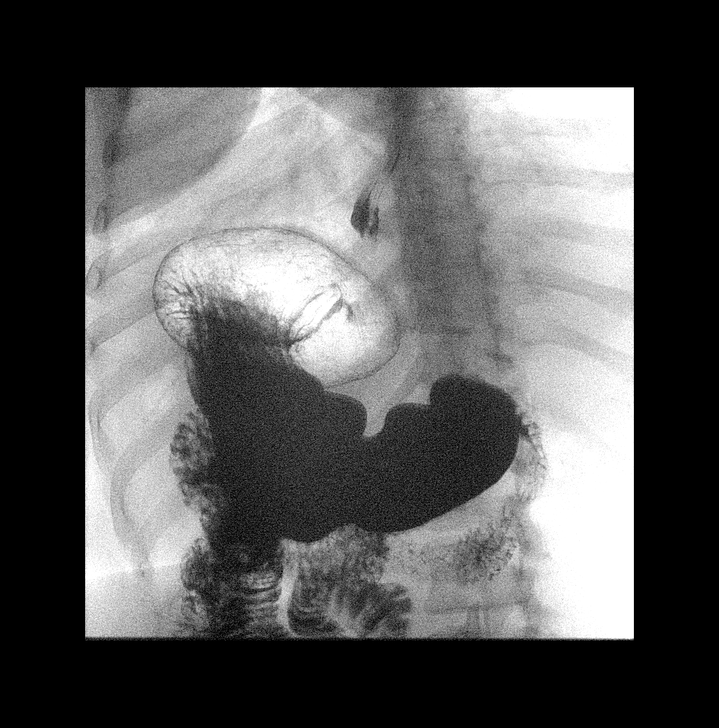

[Series 14: cp_standard · 0.56mm/px · 2 of 19 frames shown (8 of 9)]
[frame 2/19]
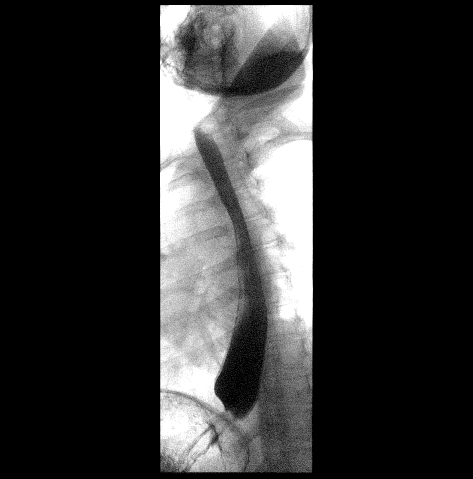
[frame 10/19]
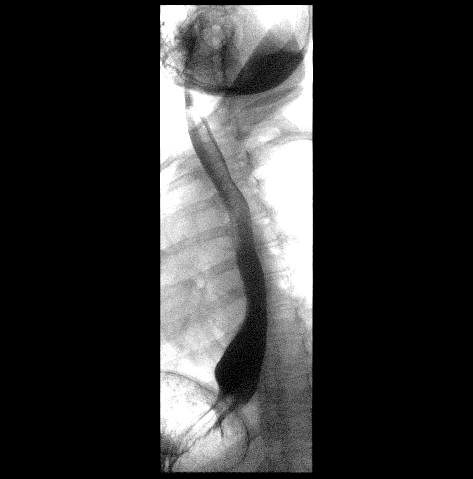

[Series 15: cp_standard · 0.28mm/px · 1 of 1 slices shown (9 of 9)]
[im 1/1]
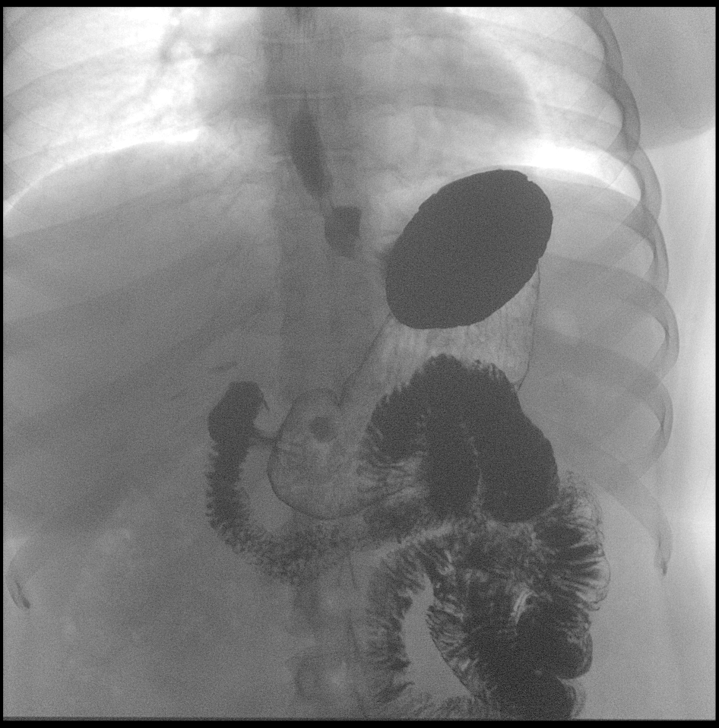

[13 of 23 positions shown; findings below may reference images not displayed]

FINDINGS: KUB: Moderate amount of stool in the colon. There is no bowel
dilatation to suggest obstruction. There is no evidence of
pneumoperitoneum, portal venous gas or pneumatosis. There are no
pathologic calcifications along the expected course of the ureters.
Prior cholecystectomy with surgical clips in the right upper
quadrant. The osseous structures are unremarkable.

Upper GI:

Examination of the esophagus demonstrated normal esophageal
motility. Normal esophageal morphology without evidence of
esophagitis or ulceration. No esophageal stricture, diverticula, or
mass lesion. No evidence of hiatal hernia. There is a small amount
of spontaneous gastroesophageal reflux.

Examination of the stomach demonstrated normal rugal folds and areae
gastricae. The gastric mucosa appeared unremarkable without evidence
of ulceration, scarring, or mass lesion. Gastric motility and
emptying was normal. Fluoroscopic examination of the duodenum
demonstrates normal motility and morphology without evidence of
ulceration or mass lesion.
IMPRESSION: 1. Mild gastroesophageal reflux.
2. Otherwise normal upper GI.

## 2019-03-16 ENCOUNTER — Ambulatory Visit: Payer: BLUE CROSS/BLUE SHIELD

## 2019-09-14 ENCOUNTER — Other Ambulatory Visit: Payer: Self-pay | Admitting: Internal Medicine

## 2019-09-14 DIAGNOSIS — N6320 Unspecified lump in the left breast, unspecified quadrant: Secondary | ICD-10-CM

## 2019-09-16 ENCOUNTER — Other Ambulatory Visit: Payer: Self-pay | Admitting: Internal Medicine

## 2019-09-16 DIAGNOSIS — N6320 Unspecified lump in the left breast, unspecified quadrant: Secondary | ICD-10-CM

## 2019-09-24 ENCOUNTER — Ambulatory Visit
Admission: RE | Admit: 2019-09-24 | Discharge: 2019-09-24 | Disposition: A | Discharge status: Home / Self Care | Payer: BC Managed Care – PPO | Source: Ambulatory Visit | Attending: Internal Medicine | Admitting: Internal Medicine

## 2019-09-24 DIAGNOSIS — N6320 Unspecified lump in the left breast, unspecified quadrant: Secondary | ICD-10-CM | POA: Insufficient documentation

## 2019-09-29 ENCOUNTER — Encounter (HOSPITAL_COMMUNITY): Payer: Self-pay

## 2020-04-13 ENCOUNTER — Encounter: Payer: Self-pay | Admitting: Internal Medicine

## 2020-04-13 ENCOUNTER — Ambulatory Visit: Payer: BC Managed Care – PPO | Admitting: Internal Medicine

## 2020-04-13 ENCOUNTER — Other Ambulatory Visit: Payer: Self-pay

## 2020-04-13 VITALS — BP 127/80 | HR 68 | Wt 175.0 lb

## 2020-04-13 DIAGNOSIS — J301 Allergic rhinitis due to pollen: Secondary | ICD-10-CM | POA: Diagnosis not present

## 2020-04-13 DIAGNOSIS — I10 Essential (primary) hypertension: Secondary | ICD-10-CM | POA: Diagnosis not present

## 2020-04-13 DIAGNOSIS — Z9884 Bariatric surgery status: Secondary | ICD-10-CM

## 2020-04-13 NOTE — Assessment & Plan Note (Signed)
Stable

## 2020-04-13 NOTE — Assessment & Plan Note (Signed)
Patient has lost 80 pounds of weight.  After the surgery she is doing very well but has developed a rash in the lower abdomen because front of the abdomen skin is hanging on the lower abdomen and is hard to clean that area and keep it dry so she has got a fungus infection on that.  I recommend that she should get the surgery done to take that apron resected.

## 2020-04-13 NOTE — Progress Notes (Signed)
Established Patient Office Visit  Subjective:  Patient ID: Krista Ferguson, female    DOB: 1976/02/02  Age: 44 y.o. MRN: [id number]  CC:  Chief Complaint  Patient presents with  . Skin Irritation    Pt had weight loss surgery 2 years ago and is having trouble with the loose skin from the weight loss     Krista Ferguson came up for evaluation of the rash in the lower abdomen.  She had gastric bypass surgery by Dr. Hassell Done 2 years ago.  She lost 80 pounds of weight now from the last 1 year she is having problems with itching and burning in the lower portion of the abdomen and recurrent infection with yeast patient has tried several kinds of cream and antifungal medication through without much relief her aunt of the abdomen is hanging like an apron on the pubic area because of the moisture in that area is hard to take care of it and keep it dry.   Krista Ferguson elevation of the rash in the lower abdomen.  Past Medical History:  Diagnosis Date  . Arthritis    Psosoratic  . GERD (gastroesophageal reflux disease)     Past Surgical History:  Procedure Laterality Date  . ABDOMINAL HYSTERECTOMY     Partial  . CESAREAN SECTION  1995  . CHOLECYSTECTOMY    . LAPAROSCOPIC GASTRIC SLEEVE RESECTION N/A 03/02/2018   Procedure: LAPAROSCOPIC GASTRIC SLEEVE RESECTION WITH HIATAL HERNIA REPAIR AND UPPER ENDOSCOPY;  Surgeon: Johnathan Hausen, MD;  Location: [facility];  Service: General;  Laterality: N/A;  . SINUSOTOMY  2011    Family History  Problem Relation Age of Onset  . Hypertension Other   . Diabetes Other   . Stroke Other   . Cancer Other   . Asthma Other   . COPD Other   . Breast cancer Maternal Aunt   . Breast cancer Maternal Grandmother     Social History   Socioeconomic History  . Marital status: Divorced    Spouse name: Not on file  . Number of children: Not on file  . Years of education: Not on file  . Highest education level: Not on file  Occupational History   . Not on file  Tobacco Use  . Smoking status: Never Smoker  . Smokeless tobacco: Never Used  Substance and Sexual Activity  . Alcohol use: Yes    Comment: Rare-2 times a year  . Drug use: Never  . Sexual activity: Yes  Other Topics Concern  . Not on file  Social History Narrative  . Not on file   Social Determinants of Health   Financial Resource Strain:   . Difficulty of Paying Living Expenses:   Food Insecurity:   . Worried About Charity fundraiser in the Last Year:   . Arboriculturist in the Last Year:   Transportation Needs:   . Film/video editor (Medical):   Marland Kitchen Lack of Transportation (Non-Medical):   Physical Activity:   . Days of Exercise per Week:   . Minutes of Exercise per Session:   Stress:   . Feeling of Stress :   Social Connections:   . Frequency of Communication with Friends and Family:   . Frequency of Social Gatherings with Friends and Family:   . Attends Religious Services:   . Active Member of Clubs or Organizations:   . Attends Archivist Meetings:   Marland Kitchen Marital Status:   Intimate Partner Violence:   .  Fear of Current or Ex-Partner:   . Emotionally Abused:   Marland Kitchen Physically Abused:   . Sexually Abused:      Current Outpatient Medications:  .  Calcipotriene-Betameth Diprop (ENSTILAR) 0.005-0.064 % FOAM, Apply 1 application topically daily as needed (for psoriasis). , Disp: , Rfl:  .  loratadine (CLARITIN) 10 MG tablet, Take 10 mg by mouth daily. , Disp: , Rfl:  .  omeprazole (PRILOSEC OTC) 20 MG tablet, Take 20 mg by mouth daily., Disp: , Rfl:    Allergies  Allergen Reactions  . Levaquin [Levofloxacin In D5w] Hives  . Sulfa Antibiotics Hives  . Sulfur Hives    ROS Review of Systems  Constitutional: Negative for chills and fatigue.  HENT: Positive for sinus pain and sneezing. Negative for facial swelling and rhinorrhea.   Eyes: Negative for pain.  Respiratory: Negative for chest tightness.   Cardiovascular: Negative.   Skin:         Patient has a rash on the lower abdomen which is inflamed has intermittent discharge.  Hematological: Negative.   Psychiatric/Behavioral: Negative.       Objective:    Physical Exam  Constitutional: She appears well-developed and well-nourished.  Eyes: Pupils are equal, round, and reactive to light.  Neck: No tracheal deviation present. No thyromegaly present.  Pulmonary/Chest: No stridor. She has wheezes.  Abdominal: There is no rebound.  Musculoskeletal:        General: Edema present.  Nursing note and vitals reviewed.   BP 127/80   Pulse 68   Wt 175 lb (79.4 kg)   BMI 31.00 kg/m  Wt Readings from Last 3 Encounters:  04/13/20 175 lb (79.4 kg)  12/08/18 176 lb 3.2 oz (79.9 kg)  09/03/18 180 lb 3.2 oz (81.7 kg)     Health Maintenance Due  Topic Date Due  . HIV Screening  Never done  . COVID-19 Vaccine (1) Never done  . PAP SMEAR-Modifier  Never done    There are no preventive care reminders to display for this patient.  No results found for: TSH Lab Results  Component Value Date   WBC 14.7 (H) 03/03/2018   HGB 13.4 03/03/2018   HCT 40.4 03/03/2018   MCV 94.2 03/03/2018   PLT 358 03/03/2018   Lab Results  Component Value Date   NA 138 03/02/2018   K 4.0 03/02/2018   CO2 24 03/02/2018   GLUCOSE 92 03/02/2018   BUN 12 03/02/2018   CREATININE 0.61 03/02/2018   BILITOT 0.8 03/02/2018   ALKPHOS 58 03/02/2018   AST 20 03/02/2018   ALT 21 03/02/2018   PROT 7.7 03/02/2018   ALBUMIN 4.0 03/02/2018   CALCIUM 9.2 03/02/2018   ANIONGAP 11 03/02/2018   No results found for: CHOL No results found for: HDL No results found for: LDLCALC No results found for: TRIG No results found for: CHOLHDL No results found for: HGBA1C    Assessment & Plan:   Problem List Items Addressed This Visit      Cardiovascular and Mediastinum   Essential hypertension     Respiratory   Seasonal allergic rhinitis due to pollen    Stable.        Other   S/P  laparoscopic sleeve gastrectomy - Primary    Patient has lost 80 pounds of weight.  After the surgery she is doing very well but has developed a rash in the lower abdomen because front of the abdomen skin is hanging on the lower abdomen and is hard  to clean that area and keep it dry so she has got a fungus infection on that.  I recommend that she should get the surgery done to take that apron resected.         No orders of the defined types were placed in this encounter.  1. S/P laparoscopic sleeve gastrectomy Doing well patient has lost 80 pounds of weight.  2. Seasonal allergic rhinitis due to pollen Allergies better on antiallergic medication. 3. Essential hypertension Stable.  Follow-up: No follow-ups on file.    Cletis Athens, MD

## 2020-04-13 NOTE — Telephone Encounter (Signed)
Dr. Lavera Guise hand wrote a rx for Finestride 5 mg, 1 tab po daily. Pt took rx with her to pharmacy.

## 2020-06-13 ENCOUNTER — Ambulatory Visit: Payer: BC Managed Care – PPO | Admitting: Internal Medicine

## 2020-06-15 ENCOUNTER — Other Ambulatory Visit: Payer: Self-pay

## 2020-06-15 ENCOUNTER — Encounter: Payer: Self-pay | Admitting: Internal Medicine

## 2020-06-15 ENCOUNTER — Ambulatory Visit: Payer: BC Managed Care – PPO | Admitting: Internal Medicine

## 2020-06-15 VITALS — BP 110/58 | HR 76 | Ht 63.0 in | Wt 173.8 lb

## 2020-06-15 DIAGNOSIS — L409 Psoriasis, unspecified: Secondary | ICD-10-CM | POA: Diagnosis not present

## 2020-06-15 DIAGNOSIS — I10 Essential (primary) hypertension: Secondary | ICD-10-CM

## 2020-06-15 DIAGNOSIS — B372 Candidiasis of skin and nail: Secondary | ICD-10-CM | POA: Diagnosis not present

## 2020-06-15 DIAGNOSIS — E8881 Metabolic syndrome: Secondary | ICD-10-CM | POA: Insufficient documentation

## 2020-06-15 MED ORDER — FLUCONAZOLE 150 MG PO TABS: 150.0000 mg | ORAL_TABLET | Freq: Every day | ORAL | 1 refills | Status: AC

## 2020-06-15 NOTE — Assessment & Plan Note (Signed)
I started the patient on Diflucan 150 mg she should take 1 pills every other day.

## 2020-06-15 NOTE — Assessment & Plan Note (Signed)
-   I encouraged the patient to lose weight.  - I educated them on making healthy dietary choices including eating more fruits and vegetables and less fried foods. - I encouraged the patient to exercise more, and educated on the benefits of exercise including weight loss, diabetes prevention, and hypertension prevention.   

## 2020-06-15 NOTE — Progress Notes (Signed)
Established Patient Office Visit  Subjective:  Patient ID: Krista Ferguson, female    DOB: [birth year]  Age: 44 y.o. MRN: [id number]  CC:  Chief Complaint  Patient presents with  . laparoscopic sleeve gastrectomy    patient here to discuss skin removal   . Psoriasis    patient feels psoriasis has worsened with the extra skin from the weight loss     Subjective:   Krista Ferguson is a 44 y.o. female who complains of a rash. Symptoms began 2 week ago. Patient describes the rash as erythematous. Characteristics of rash and associated history: Similar problem in household members? no. Patient's previous dermatologic history includes candida. Family history of derm problems: candida. Medications currently using: ketoconazole. Environmental exposures or allergies: none   Review of Systems Pertinent items noted in HPI and remainder of comprehensive ROS otherwise negative.   Objective:  BP (!) 110/58   Pulse 76   Ht 5\' 3"  (1.6 m)   Wt 173 lb 12.8 oz (78.8 kg)   BMI 30.79 kg/m   1.  ketoconazole 2.  Verbal patient instruction given. 3. Follow up as needed for acute illness    Joelie Schou presents for Yeast infection in her lower abdomen.  He is applying Mycostatin cream.  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  He also has Psoriatec arthritis and sees dermatology for that.  Denies any fever nausea vomiting chest pain shortness of breath.  Past Medical History:  Diagnosis Date  . Arthritis    Psosoratic  . GERD (gastroesophageal reflux disease)     Past Surgical History:  Procedure Laterality Date  . ABDOMINAL HYSTERECTOMY     Partial  . CESAREAN SECTION  1995  . CHOLECYSTECTOMY    . LAPAROSCOPIC GASTRIC SLEEVE RESECTION N/A 03/02/2018   Procedure: LAPAROSCOPIC GASTRIC SLEEVE RESECTION WITH HIATAL HERNIA REPAIR AND UPPER ENDOSCOPY;  Surgeon: Johnathan Hausen, MD;  Location: [facility];  Service: General;  Laterality: N/A;  . SINUSOTOMY   2011    Family History  Problem Relation Age of Onset  . Hypertension Other   . Diabetes Other   . Stroke Other   . Cancer Other   . Asthma Other   . COPD Other   . Breast cancer Maternal Aunt   . Breast cancer Maternal Grandmother     Social History   Socioeconomic History  . Marital status: Divorced    Spouse name: Not on file  . Number of children: Not on file  . Years of education: Not on file  . Highest education level: Not on file  Occupational History  . Not on file  Tobacco Use  . Smoking status: Never Smoker  . Smokeless tobacco: Never Used  Vaping Use  . Vaping Use: Never used  Substance and Sexual Activity  . Alcohol use: Yes    Comment: Rare-2 times a year  . Drug use: Never  . Sexual activity: Yes  Other Topics Concern  . Not on file  Social History Narrative  . Not on file   Social Determinants of Health   Financial Resource Strain:   . Difficulty of Paying Living Expenses:   Food Insecurity:   . Worried About Charity fundraiser in the Last Year:   . Arboriculturist in the Last Year:   Transportation Needs:   . Film/video editor (Medical):   Marland Kitchen Lack of Transportation (Non-Medical):   Physical Activity:   . Days of Exercise per  Week:   . Minutes of Exercise per Session:   Stress:   . Feeling of Stress :   Social Connections:   . Frequency of Communication with Friends and Family:   . Frequency of Social Gatherings with Friends and Family:   . Attends Religious Services:   . Active Member of Clubs or Organizations:   . Attends Archivist Meetings:   Marland Kitchen Marital Status:   Intimate Partner Violence:   . Fear of Current or Ex-Partner:   . Emotionally Abused:   Marland Kitchen Physically Abused:   . Sexually Abused:      Current Outpatient Medications:  .  Calcipotriene-Betameth Diprop (ENSTILAR) 0.005-0.064 % FOAM, Apply 1 application topically daily as needed (for psoriasis). , Disp: , Rfl:  .  loratadine (CLARITIN) 10 MG tablet, Take  10 mg by mouth daily. , Disp: , Rfl:  .  omeprazole (PRILOSEC OTC) 20 MG tablet, Take 20 mg by mouth daily., Disp: , Rfl:  .  fluconazole (DIFLUCAN) 150 MG tablet, Take 1 tablet (150 mg total) by mouth daily for 3 days., Disp: 3 tablet, Rfl: 1   Allergies  Allergen Reactions  . Levaquin [Levofloxacin In D5w] Hives  . Sulfa Antibiotics Hives  . Sulfur Hives    ROS Review of Systems  Constitutional: Negative.   HENT: Negative.   Eyes: Negative.   Respiratory: Negative.   Cardiovascular: Negative.   Gastrointestinal: Negative.   Endocrine: Negative.   Genitourinary: Negative.   Musculoskeletal: Negative.   Skin: Positive for rash.       Patient has a candidate he has a rash in the lower portion of the abdomen.  She has been applying Mycostatin cream for that.  Allergic/Immunologic: Negative.   Neurological: Negative.   Hematological: Negative.   Psychiatric/Behavioral: Negative.   All other systems reviewed and are negative.     Objective:    Physical Exam Vitals reviewed.  Constitutional:      Appearance: Normal appearance.  HENT:     Mouth/Throat:     Mouth: Mucous membranes are moist.  Eyes:     Pupils: Pupils are equal, round, and reactive to light.  Neck:     Vascular: No carotid bruit.  Cardiovascular:     Rate and Rhythm: Normal rate and regular rhythm.     Pulses: Normal pulses.     Heart sounds: Normal heart sounds.  Pulmonary:     Effort: Pulmonary effort is normal.     Breath sounds: Normal breath sounds.  Abdominal:     General: Bowel sounds are normal.     Palpations: Abdomen is soft. There is no hepatomegaly, splenomegaly or mass.     Tenderness: There is no abdominal tenderness.     Hernia: No hernia is present.  Musculoskeletal:        General: No tenderness.     Cervical back: Neck supple.     Right lower leg: No edema.     Left lower leg: No edema.  Skin:    Findings: No rash.     Comments: maculo papular rash in the skin of the lower  abdomen.  Neurological:     Mental Status: She is alert and oriented to person, place, and time.     Motor: No weakness.  Psychiatric:        Mood and Affect: Mood and affect normal.        Behavior: Behavior normal.     BP (!) 110/58   Pulse 76  Ht 5\' 3"  (1.6 m)   Wt 173 lb 12.8 oz (78.8 kg)   BMI 30.79 kg/m  Wt Readings from Last 3 Encounters:  06/15/20 173 lb 12.8 oz (78.8 kg)  04/13/20 175 lb (79.4 kg)  12/08/18 176 lb 3.2 oz (79.9 kg)     Health Maintenance Due  Topic Date Due  . Hepatitis C Screening  Never done  . COVID-19 Vaccine (1) Never done  . HIV Screening  Never done  . PAP SMEAR-Modifier  Never done    There are no preventive care reminders to display for this patient.  No results found for: TSH Lab Results  Component Value Date   WBC 14.7 (H) 03/03/2018   HGB 13.4 03/03/2018   HCT 40.4 03/03/2018   MCV 94.2 03/03/2018   PLT 358 03/03/2018   Lab Results  Component Value Date   NA 138 03/02/2018   K 4.0 03/02/2018   CO2 24 03/02/2018   GLUCOSE 92 03/02/2018   BUN 12 03/02/2018   CREATININE 0.61 03/02/2018   BILITOT 0.8 03/02/2018   ALKPHOS 58 03/02/2018   AST 20 03/02/2018   ALT 21 03/02/2018   PROT 7.7 03/02/2018   ALBUMIN 4.0 03/02/2018   CALCIUM 9.2 03/02/2018   ANIONGAP 11 03/02/2018   No results found for: CHOL No results found for: HDL No results found for: LDLCALC No results found for: TRIG No results found for: CHOLHDL No results found for: HGBA1C    Assessment & Plan:   Problem List Items Addressed This Visit      Cardiovascular and Mediastinum   Essential hypertension    - Today, the patient's blood pressure is well managed on present  med. - The patient will continue the current treatment regimen.  - I encouraged the patient to eat a low-sodium diet to help control blood pressure. - I encouraged the patient to live an active lifestyle and complete activities that increases heart rate to 85% target heart rate at  least 5 times per week for one hour.            Musculoskeletal and Integument   Yeast dermatitis - Primary    I started the patient on Diflucan 150 mg she should take 1 pills every other day.      Relevant Medications   fluconazole (DIFLUCAN) 150 MG tablet   Other Relevant Orders   Ambulatory referral to General Surgery   Psoriasis, unspecified    Patient complaining of multiple joint complaints of psoriatic arthritis.  I asked her that she should see his skin specialist as well as a rheumatologist.      Relevant Orders   Ambulatory referral to Dermatology     Other   Metabolic syndrome    - I encouraged the patient to lose weight.  - I educated them on making healthy dietary choices including eating more fruits and vegetables and less fried foods. - I encouraged the patient to exercise more, and educated on the benefits of exercise including weight loss, diabetes prevention, and hypertension prevention.           Meds ordered this encounter  Medications  . fluconazole (DIFLUCAN) 150 MG tablet    Sig: Take 1 tablet (150 mg total) by mouth daily for 3 days.    Dispense:  3 tablet    Refill:  1    Follow-up: No follow-ups on file.    Cletis Athens, MD

## 2020-06-15 NOTE — Assessment & Plan Note (Signed)
Patient complaining of multiple joint complaints of psoriatic arthritis.  I asked her that she should see his skin specialist as well as a rheumatologist.

## 2020-06-15 NOTE — Assessment & Plan Note (Signed)
-   Today, the patient's blood pressure is well managed on present med. - The patient will continue the current treatment regimen.  - I encouraged the patient to eat a low-sodium diet to help control blood pressure. - I encouraged the patient to live an active lifestyle and complete activities that increases heart rate to 85% target heart rate at least 5 times per week for one hour.     

## 2020-06-28 ENCOUNTER — Ambulatory Visit: Payer: BC Managed Care – PPO | Admitting: Internal Medicine

## 2020-07-05 ENCOUNTER — Encounter: Payer: Self-pay | Admitting: Internal Medicine

## 2020-07-05 ENCOUNTER — Other Ambulatory Visit: Payer: Self-pay

## 2020-07-05 ENCOUNTER — Ambulatory Visit: Payer: BC Managed Care – PPO | Admitting: Internal Medicine

## 2020-07-05 VITALS — BP 112/71 | HR 67 | Ht 63.0 in | Wt 174.6 lb

## 2020-07-05 DIAGNOSIS — B379 Candidiasis, unspecified: Secondary | ICD-10-CM

## 2020-07-05 DIAGNOSIS — J0101 Acute recurrent maxillary sinusitis: Secondary | ICD-10-CM | POA: Diagnosis not present

## 2020-07-05 DIAGNOSIS — I10 Essential (primary) hypertension: Secondary | ICD-10-CM | POA: Diagnosis not present

## 2020-07-05 DIAGNOSIS — L659 Nonscarring hair loss, unspecified: Secondary | ICD-10-CM

## 2020-07-05 DIAGNOSIS — J0100 Acute maxillary sinusitis, unspecified: Secondary | ICD-10-CM | POA: Insufficient documentation

## 2020-07-05 MED ORDER — FLUCONAZOLE 150 MG PO TABS: 150.0000 mg | ORAL_TABLET | ORAL | 1 refills | Status: AC

## 2020-07-05 MED ORDER — FINASTERIDE 5 MG PO TABS: 5.0000 mg | ORAL_TABLET | Freq: Every day | ORAL | 1 refills | Status: DC

## 2020-07-05 MED ORDER — AZITHROMYCIN 250 MG PO TABS: ORAL_TABLET | ORAL | 0 refills | Status: DC

## 2020-07-05 MED ORDER — NYSTATIN 100000 UNIT/GM EX POWD: 1.0000 "application " | Freq: Three times a day (TID) | CUTANEOUS | 3 refills | Status: DC

## 2020-07-05 NOTE — Assessment & Plan Note (Signed)
Pt has tender right maxillary sinus with redness of the throat which is congested. She was started on Zpac as directed

## 2020-07-05 NOTE — Assessment & Plan Note (Signed)
Pt has dermatitis of the lower abdomen which is being managed with diflucan and nystatin powder. If she is not better she will be referred to dermatology.

## 2020-07-05 NOTE — Progress Notes (Signed)
Established Patient Office Visit  SUBJECTIVE:  Subjective  Patient ID: Krista Ferguson, female    DOB: 1976-02-24  Age: 44 y.o. MRN: [id number]  CC:  Chief Complaint  Patient presents with  . Rash    Patient presents today for a 2 week follow up for a rash on her lower abdomen.   . Sinusitis    Patient also reports she is having blood and dryness in her right nostril for the past week    HPI Krista Ferguson is a 44 y.o. female presenting today for a 2 week follow up for her lower abdominal rash.   She notes that she continues to have a rash under her lower abdomen above her pubic area. She notes that under her right side, the skin hurts where it rubs together. She sees Dr. Nehemiah Massed in Dermatology. She notes that she has tried [employer] powder without help. She took three doses of Diflucan without resolve. She also has tried rash creams. She notes that occasionally she will put a tube sock under her stomach to keep the area dry.   She thinks that she is getting a sinus infection. She is congested and when she blew her nose she noticed that it was red tinged. She denies ear pain or drainage.   She needs a refill of her finasteride for her hair growth.  Past Medical History:  Diagnosis Date  . Arthritis    Psosoratic  . GERD (gastroesophageal reflux disease)     Past Surgical History:  Procedure Laterality Date  . ABDOMINAL HYSTERECTOMY     Partial  . CESAREAN SECTION  1995  . CHOLECYSTECTOMY    . LAPAROSCOPIC GASTRIC SLEEVE RESECTION N/A 03/02/2018   Procedure: LAPAROSCOPIC GASTRIC SLEEVE RESECTION WITH HIATAL HERNIA REPAIR AND UPPER ENDOSCOPY;  Surgeon: Johnathan Hausen, MD;  Location: [facility];  Service: General;  Laterality: N/A;  . SINUSOTOMY  2011    Family History  Problem Relation Age of Onset  . Hypertension Other   . Diabetes Other   . Stroke Other   . Cancer Other   . Asthma Other   . COPD Other   . Breast cancer Maternal Aunt   . Breast  cancer Maternal Grandmother     Social History   Socioeconomic History  . Marital status: Divorced    Spouse name: Not on file  . Number of children: Not on file  . Years of education: Not on file  . Highest education level: Not on file  Occupational History  . Not on file  Tobacco Use  . Smoking status: Never Smoker  . Smokeless tobacco: Never Used  Vaping Use  . Vaping Use: Never used  Substance and Sexual Activity  . Alcohol use: Yes    Comment: Rare-2 times a year  . Drug use: Never  . Sexual activity: Yes  Other Topics Concern  . Not on file  Social History Narrative  . Not on file   Social Determinants of Health   Financial Resource Strain:   . Difficulty of Paying Living Expenses:   Food Insecurity:   . Worried About Charity fundraiser in the Last Year:   . Arboriculturist in the Last Year:   Transportation Needs:   . Film/video editor (Medical):   Marland Kitchen Lack of Transportation (Non-Medical):   Physical Activity:   . Days of Exercise per Week:   . Minutes of Exercise per Session:   Stress:   . Feeling  of Stress :   Social Connections:   . Frequency of Communication with Friends and Family:   . Frequency of Social Gatherings with Friends and Family:   . Attends Religious Services:   . Active Member of Clubs or Organizations:   . Attends Archivist Meetings:   Marland Kitchen Marital Status:   Intimate Partner Violence:   . Fear of Current or Ex-Partner:   . Emotionally Abused:   Marland Kitchen Physically Abused:   . Sexually Abused:      Current Outpatient Medications:  .  azithromycin (ZITHROMAX) 250 MG tablet, 2 tab po daily  For 3 days, Disp: 6 tablet, Rfl: 0 .  Calcipotriene-Betameth Diprop (ENSTILAR) 0.005-0.064 % FOAM, Apply 1 application topically daily as needed (for psoriasis). , Disp: , Rfl:  .  finasteride (PROSCAR) 5 MG tablet, Take 1 tablet (5 mg total) by mouth daily., Disp: 90 tablet, Rfl: 1 .  fluconazole (DIFLUCAN) 150 MG tablet, Take 1 tablet (150  mg total) by mouth 1 day or 1 dose for 4 days., Disp: 4 tablet, Rfl: 1 .  loratadine (CLARITIN) 10 MG tablet, Take 10 mg by mouth daily. , Disp: , Rfl:  .  nystatin (MYCOSTATIN/NYSTOP) powder, Apply 1 application topically 3 (three) times daily., Disp: 30 g, Rfl: 3 .  omeprazole (PRILOSEC OTC) 20 MG tablet, Take 20 mg by mouth daily., Disp: , Rfl:    Allergies  Allergen Reactions  . Levaquin [Levofloxacin In D5w] Hives  . Sulfa Antibiotics Hives  . Sulfur Hives    ROS Review of Systems  Constitutional: Negative.   HENT: Positive for congestion, nosebleeds and sinus pressure. Negative for ear discharge and ear pain.   Eyes: Negative.   Respiratory: Negative.   Cardiovascular: Negative.   Gastrointestinal: Negative.   Endocrine: Negative.   Genitourinary: Negative.   Musculoskeletal: Negative.   Skin: Positive for rash (lower abdomin).  Allergic/Immunologic: Negative.   Neurological: Negative.   Hematological: Negative.   Psychiatric/Behavioral: Negative.   All other systems reviewed and are negative.    OBJECTIVE:    Physical Exam Vitals reviewed.  Constitutional:      Appearance: Normal appearance.  HENT:     Nose: Nose normal.     Mouth/Throat:     Mouth: Mucous membranes are moist.  Eyes:     Pupils: Pupils are equal, round, and reactive to light.  Neck:     Vascular: No carotid bruit.  Cardiovascular:     Rate and Rhythm: Normal rate and regular rhythm.     Pulses: Normal pulses.     Heart sounds: Normal heart sounds.  Pulmonary:     Effort: Pulmonary effort is normal.     Breath sounds: Normal breath sounds.  Abdominal:     General: Bowel sounds are normal.     Palpations: Abdomen is soft. There is no hepatomegaly, splenomegaly or mass.     Tenderness: There is no abdominal tenderness.     Hernia: No hernia is present.  Musculoskeletal:        General: No tenderness.     Cervical back: Neck supple.     Right lower leg: No edema.     Left lower leg: No  edema.  Skin:    Findings: Rash (lower abdomen above pubic area) present.  Neurological:     Mental Status: She is alert and oriented to person, place, and time.     Motor: No weakness.  Psychiatric:        Mood and  Affect: Mood and affect normal.        Behavior: Behavior normal.     BP 112/71   Pulse 67   Ht 5\' 3"  (1.6 m)   Wt 174 lb 9.6 oz (79.2 kg)   BMI 30.93 kg/m  Wt Readings from Last 3 Encounters:  07/05/20 174 lb 9.6 oz (79.2 kg)  06/15/20 173 lb 12.8 oz (78.8 kg)  04/13/20 175 lb (79.4 kg)    Health Maintenance Due  Topic Date Due  . Hepatitis C Screening  Never done  . COVID-19 Vaccine (1) Never done  . HIV Screening  Never done  . PAP SMEAR-Modifier  Never done  . INFLUENZA VACCINE  06/25/2020    There are no preventive care reminders to display for this patient.  CBC Latest Ref Rng & Units 03/03/2018 03/02/2018 02/23/2018  WBC 4.0 - 10.5 K/uL 14.7(H) 7.9 7.9  Hemoglobin 12.0 - 15.0 g/dL 13.4 13.8 14.3  Hematocrit 36 - 46 % 40.4 42.8 42.4  Platelets 150 - 400 K/uL 358 353 377   CMP Latest Ref Rng & Units 03/02/2018 02/23/2018  Glucose 65 - 99 mg/dL 92 106(H)  BUN 6 - 20 mg/dL 12 9  Creatinine 0.44 - 1.00 mg/dL 0.61 0.71  Sodium 135 - 145 mmol/L 138 139  Potassium 3.5 - 5.1 mmol/L 4.0 4.1  Chloride 101 - 111 mmol/L 103 103  CO2 22 - 32 mmol/L 24 28  Calcium 8.9 - 10.3 mg/dL 9.2 9.3  Total Protein 6.5 - 8.1 g/dL 7.7 -  Total Bilirubin 0.3 - 1.2 mg/dL 0.8 -  Alkaline Phos 38 - 126 U/L 58 -  AST 15 - 41 U/L 20 -  ALT 14 - 54 U/L 21 -    No results found for: TSH Lab Results  Component Value Date   ALBUMIN 4.0 03/02/2018   ANIONGAP 11 03/02/2018   No results found for: CHOL, HDL, LDLCALC, CHOLHDL No results found for: TRIG No results found for: HGBA1C    ASSESSMENT & PLAN:   Problem List Items Addressed This Visit      Cardiovascular and Mediastinum   Essential hypertension    Blood pressure is under control without any medication at the present  time. Pt has lost a lot of weight and she is controlling her diet.         Respiratory   Acute recurrent maxillary sinusitis - Primary    Pt has tender right maxillary sinus with redness of the throat which is congested. She was started on Zpac as directed      Relevant Medications   fluconazole (DIFLUCAN) 150 MG tablet   azithromycin (ZITHROMAX) 250 MG tablet     Musculoskeletal and Integument   Alopecia    Pt was started on Finasteride PO daily      Relevant Medications   finasteride (PROSCAR) 5 MG tablet     Other   Candidiasis    Pt has dermatitis of the lower abdomen which is being managed with diflucan and nystatin powder. If she is not better she will be referred to dermatology.       Relevant Medications   nystatin (MYCOSTATIN/NYSTOP) powder   fluconazole (DIFLUCAN) 150 MG tablet   azithromycin (ZITHROMAX) 250 MG tablet      Meds ordered this encounter  Medications  . nystatin (MYCOSTATIN/NYSTOP) powder    Sig: Apply 1 application topically 3 (three) times daily.    Dispense:  30 g    Refill:  3  .  fluconazole (DIFLUCAN) 150 MG tablet    Sig: Take 1 tablet (150 mg total) by mouth 1 day or 1 dose for 4 days.    Dispense:  4 tablet    Refill:  1  . azithromycin (ZITHROMAX) 250 MG tablet    Sig: 2 tab po daily  For 3 days    Dispense:  6 tablet    Refill:  0  . finasteride (PROSCAR) 5 MG tablet    Sig: Take 1 tablet (5 mg total) by mouth daily.    Dispense:  90 tablet    Refill:  1    Follow-up: Return in about 3 months (around 10/05/2020).    Dr. Jane Canary [facility] [address] [place], [place] [address]   By signing my name below, I, [employer], attest that this documentation has been prepared under the direction and in the presence of Cletis Athens, MD. Electronically Signed: Cletis Athens, MD 07/05/20, 10:49 AM   I personally performed the services described in this documentation, which was SCRIBED in my presence. The  recorded information has been reviewed and considered accurate. It has been edited as necessary during review. Cletis Athens, MD

## 2020-07-05 NOTE — Assessment & Plan Note (Signed)
Pt was started on Finasteride PO daily

## 2020-07-05 NOTE — Assessment & Plan Note (Signed)
Blood pressure is under control without any medication at the present time. Pt has lost a lot of weight and she is controlling her diet.

## 2020-08-11 ENCOUNTER — Other Ambulatory Visit: Payer: Self-pay | Admitting: Internal Medicine

## 2020-08-18 ENCOUNTER — Other Ambulatory Visit: Payer: Self-pay

## 2020-08-18 ENCOUNTER — Encounter: Payer: Self-pay | Admitting: Plastic Surgery

## 2020-08-18 ENCOUNTER — Ambulatory Visit: Payer: BC Managed Care – PPO | Admitting: Plastic Surgery

## 2020-08-18 VITALS — BP 131/72 | HR 66 | Temp 98.8°F | Ht 63.0 in | Wt 177.2 lb

## 2020-08-18 DIAGNOSIS — M793 Panniculitis, unspecified: Secondary | ICD-10-CM | POA: Insufficient documentation

## 2020-08-18 DIAGNOSIS — Z9884 Bariatric surgery status: Secondary | ICD-10-CM

## 2020-08-18 DIAGNOSIS — B379 Candidiasis, unspecified: Secondary | ICD-10-CM
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
## 2020-09-04 ENCOUNTER — Encounter: Payer: Self-pay | Admitting: Surgical
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
## 2020-09-05 ENCOUNTER — Encounter: Payer: Self-pay | Admitting: Surgical

## 2020-09-05 ENCOUNTER — Other Ambulatory Visit: Payer: Self-pay

## 2020-09-05 ENCOUNTER — Ambulatory Visit (INDEPENDENT_AMBULATORY_CARE_PROVIDER_SITE_OTHER): Payer: BC Managed Care – PPO | Admitting: Surgical

## 2020-09-05 VITALS — BP 129/82 | HR 65 | Temp 98.1°F | Ht 63.0 in | Wt 177.0 lb

## 2020-09-05 DIAGNOSIS — Z9884 Bariatric surgery status: Secondary | ICD-10-CM

## 2020-09-05 DIAGNOSIS — B379 Candidiasis, unspecified: Secondary | ICD-10-CM

## 2020-09-05 DIAGNOSIS — Z719 Counseling, unspecified: Secondary | ICD-10-CM

## 2020-09-05 DIAGNOSIS — M793 Panniculitis, unspecified: Secondary | ICD-10-CM

## 2020-09-05 MED ORDER — HYDROCODONE-ACETAMINOPHEN 5-325 MG PO TABS: 1.0000 | ORAL_TABLET | Freq: Four times a day (QID) | ORAL | 0 refills | Status: AC | PRN

## 2020-09-05 MED ORDER — CEPHALEXIN 500 MG PO CAPS: 500.0000 mg | ORAL_CAPSULE | Freq: Four times a day (QID) | ORAL | 0 refills | Status: AC

## 2020-09-05 MED ORDER — ONDANSETRON HCL 4 MG PO TABS: 4.0000 mg | ORAL_TABLET | Freq: Three times a day (TID) | ORAL | 0 refills | Status: DC | PRN

## 2020-10-03 ENCOUNTER — Telehealth: Payer: Self-pay | Admitting: Surgical

## 2020-10-03 NOTE — Telephone Encounter (Signed)
Patient wanted some clarity on her post op instructions. She said on the sheet it says she can shower after 5 days, but beside that it says she should not take the binder off. Patient wants to know if she should remove the binder and put it back on after the shower or if she should do something different. Please call mobile to advise.

## 2020-10-05 ENCOUNTER — Ambulatory Visit: Payer: BC Managed Care – PPO | Admitting: Internal Medicine

## 2020-10-05 NOTE — Telephone Encounter (Signed)
Called and spoke with the patient on (10/04/20) regarding the message below.  Informed her that I spoke with [facility] regarding her message, and he stated yes she can take the binder off and shower.    Informed her when she takes the binder off only take off the pads that are not stuck to her.  She's not to take the brown pads off only the white.  Then she can shower and place the white pads back on.  Patient verbalized understanding and agreed.//AB/CMA

## 2020-10-06 ENCOUNTER — Ambulatory Visit (INDEPENDENT_AMBULATORY_CARE_PROVIDER_SITE_OTHER): Payer: BC Managed Care – PPO | Admitting: Plastic Surgery

## 2020-10-06 ENCOUNTER — Other Ambulatory Visit: Payer: Self-pay

## 2020-10-06 ENCOUNTER — Encounter: Payer: Self-pay | Admitting: Plastic Surgery

## 2020-10-06 VITALS — BP 109/63 | HR 79

## 2020-10-06 DIAGNOSIS — M793 Panniculitis, unspecified: Secondary | ICD-10-CM

## 2020-10-06 NOTE — Progress Notes (Signed)
The patient is a 44 yrs old wf here for follow up after a panniculectomy / abdominoplasty.  Her drain output has been minimal.  No hematoma or seroma noted.  We removed the right drain and the dressings.  She can shower and start wearing a spanx.  Follow up in 1 week.

## 2020-10-11 ENCOUNTER — Ambulatory Visit: Payer: BC Managed Care – PPO | Admitting: Dermatology

## 2020-10-13 ENCOUNTER — Ambulatory Visit (INDEPENDENT_AMBULATORY_CARE_PROVIDER_SITE_OTHER): Payer: BC Managed Care – PPO | Admitting: Surgical

## 2020-10-13 ENCOUNTER — Other Ambulatory Visit: Payer: Self-pay

## 2020-10-13 ENCOUNTER — Encounter: Payer: Self-pay | Admitting: Surgical

## 2020-10-13 VITALS — BP 116/65 | HR 71 | Temp 98.4°F

## 2020-10-13 DIAGNOSIS — M793 Panniculitis, unspecified: Secondary | ICD-10-CM

## 2020-10-13 NOTE — Progress Notes (Signed)
Patient is a 44 year old female here for follow-up after abdominoplasty/panniculectomy with Dr. Marla Roe on 09/28/2020.  She is 2 weeks postop.  She reports that she is doing well.  She reports drainage from the left JP drain has been very minimal.  Today on exam she has less than 5 cc in her drain over the last 12 hours.  She reports normal bowel movements.  Chaperone present on exam On exam abdominal incision intact, umbilicus incision intact.  Left JP drain in place.  5 cc of serosanguineous drainage in left JP drain bulb.  No seroma or hematoma noted.  No erythema noted.  No incisional dehiscence noted.  No swelling noted.  Abdomen is mildly tender to palpation.  We remove the left JP drain today, patient tolerated this fine.  We removed the Aquacel Ag dressings.  ABD pads were applied.  Continue to wear spanks.  Continue to avoid strenuous activity.  Continue to sleep in a reclined position.  Call with any questions or concerns.  Follow-up in 2 weeks for reevaluation.

## 2020-10-27 ENCOUNTER — Ambulatory Visit (INDEPENDENT_AMBULATORY_CARE_PROVIDER_SITE_OTHER): Payer: BC Managed Care – PPO | Admitting: Surgical

## 2020-10-27 ENCOUNTER — Encounter: Payer: Self-pay | Admitting: Surgical

## 2020-10-27 ENCOUNTER — Other Ambulatory Visit: Payer: Self-pay

## 2020-10-27 VITALS — BP 114/73 | HR 79 | Temp 98.1°F

## 2020-10-27 DIAGNOSIS — M793 Panniculitis, unspecified: Secondary | ICD-10-CM

## 2020-10-27 NOTE — Progress Notes (Signed)
Patient is a 44 year old female here for follow-up after abdominal surgery with Dr. Marla Roe on 09/28/2020.  She is 4 weeks postop.  Patient reports she is doing really well.  She reports occasional soreness of her abdomen, but no severe pain.  She has no pain today.  She is not having any infectious symptoms.  She is very pleased with how things are healing.  She has continued to wear her compression garment and sleep in a reclined position.  Chaperone present on exam On exam lower abdominal incision intact, healing nicely.  Umbilicus incision intact and healing nicely as well.  There is no necrosis noted.  No incisional dehiscence noted.  No foul odors or drainage noted.  Bilateral JP drain insertion sites are healing nicely.  Recommend continue her compression garment 24/7 for 2 more weeks. Continue to avoid heavy lifting for 2 more weeks. Avoid strenuous activity for 2 more weeks. We discussed scheduling a follow-up in 1 month for reevaluation.  I recommend she call with any questions or concerns prior to her follow-up appointment. Patient is agreement with current plan, she is will be sure to call us with any questions or concerns.

## 2020-11-29 ENCOUNTER — Ambulatory Visit (INDEPENDENT_AMBULATORY_CARE_PROVIDER_SITE_OTHER): Payer: BC Managed Care – PPO | Admitting: Plastic Surgery

## 2020-11-29 ENCOUNTER — Encounter: Payer: Self-pay | Admitting: Surgical

## 2020-11-29 ENCOUNTER — Other Ambulatory Visit: Payer: Self-pay

## 2020-11-29 VITALS — BP 125/72 | HR 68 | Temp 97.1°F

## 2020-11-29 DIAGNOSIS — Z9889 Other specified postprocedural states: Secondary | ICD-10-CM

## 2020-11-29 NOTE — Progress Notes (Signed)
Patient is a 45 yr old female here for follow up after undergoing a Panniculectomy with upper abdomen liposuctionon 09/28/20 with Dr. Ulice Bold.   ~ 9 weeks PO Patient reports she is doing very well. No complaints. Abdominal and umbilical incisions are healing very well, C/D/I.  No signs of infection, redness, drainage, seroma/hematoma.  Bellybutton has good cap refill present.  Patient is very pleased with her results.  She may resume all normal activities gradually as tolerated.  She may shower normally.  Follow-up as needed.  Return precautions provided.  Call office with any questions/concerns.  Pictures were obtained of the patient and placed in the chart with the patient's or guardian's permission.

## 2020-12-18 ENCOUNTER — Other Ambulatory Visit: Payer: Self-pay

## 2020-12-18 ENCOUNTER — Encounter: Payer: Self-pay | Admitting: Dermatology

## 2020-12-18 ENCOUNTER — Ambulatory Visit: Payer: BC Managed Care – PPO | Admitting: Dermatology

## 2020-12-18 DIAGNOSIS — L578 Other skin changes due to chronic exposure to nonionizing radiation: Secondary | ICD-10-CM

## 2020-12-18 DIAGNOSIS — L814 Other melanin hyperpigmentation: Secondary | ICD-10-CM

## 2020-12-18 DIAGNOSIS — D229 Melanocytic nevi, unspecified: Secondary | ICD-10-CM

## 2020-12-18 DIAGNOSIS — D2261 Melanocytic nevi of right upper limb, including shoulder: Secondary | ICD-10-CM

## 2020-12-18 DIAGNOSIS — D225 Melanocytic nevi of trunk: Secondary | ICD-10-CM | POA: Diagnosis not present

## 2020-12-18 DIAGNOSIS — Z86018 Personal history of other benign neoplasm: Secondary | ICD-10-CM

## 2020-12-18 DIAGNOSIS — L649 Androgenic alopecia, unspecified: Secondary | ICD-10-CM

## 2020-12-18 DIAGNOSIS — Z1283 Encounter for screening for malignant neoplasm of skin: Secondary | ICD-10-CM

## 2020-12-18 DIAGNOSIS — L409 Psoriasis, unspecified: Secondary | ICD-10-CM | POA: Diagnosis not present

## 2020-12-18 DIAGNOSIS — D2262 Melanocytic nevi of left upper limb, including shoulder: Secondary | ICD-10-CM | POA: Diagnosis not present

## 2020-12-18 DIAGNOSIS — D2272 Melanocytic nevi of left lower limb, including hip: Secondary | ICD-10-CM

## 2020-12-18 DIAGNOSIS — D224 Melanocytic nevi of scalp and neck: Secondary | ICD-10-CM

## 2020-12-18 DIAGNOSIS — D485 Neoplasm of uncertain behavior of skin: Secondary | ICD-10-CM

## 2020-12-18 DIAGNOSIS — D2371 Other benign neoplasm of skin of right lower limb, including hip: Secondary | ICD-10-CM

## 2020-12-18 DIAGNOSIS — D2271 Melanocytic nevi of right lower limb, including hip: Secondary | ICD-10-CM

## 2020-12-18 DIAGNOSIS — L918 Other hypertrophic disorders of the skin: Secondary | ICD-10-CM

## 2020-12-18 NOTE — Progress Notes (Signed)
Follow-Up Visit   Subjective  Krista Ferguson is a 45 y.o. female who presents for the following: Annual Exam (She has a history of atypical nevi removed in the past. She also has psoriasis of the ears, using Enstilar Foam. She has had Xtrac laser in the past.). 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 She has had improvement.  She has some moles which are irritated by her bra that she would like removed.   The following portions of the chart were reviewed this encounter and updated as appropriate:       Review of Systems:  No other skin or systemic complaints except as noted in HPI or Assessment and Plan.  Objective  Well appearing patient in no apparent distress; mood and affect are within normal limits.  A full examination was performed including scalp, head, eyes, ears, nose, lips, neck, chest, axillae, abdomen, back, buttocks, bilateral upper extremities, bilateral lower extremities, hands, feet, fingers, toes, fingernails, and toenails. All findings within normal limits unless otherwise noted below.  Objective  bil ear canal, preauricular: erythema and scale R>L ear canal, R preauricular/temporal scalp.  Objective  Scalp: Diffuse thinning of the crown and widening of the midline part with retention of the frontal hairline - Reviewed progressive nature and prognosis.   Objective  L lower neck: 7.22mm pink brown fleshy papule  Objective  L medial shoulder: 6.33mm pink brown papule  Objective  Left Abdomen: 6.62mm dark brown papule   Objective  Right Upper Arm: 3.35mm medium dark brown papule- R upper arm  Right 2nd Plantar Toe: 4.21mm brown macule  Left upper pretibia: 3.9mm brown macule slight irreg border   Right Upper Anterior Thigh: 4.14mm medium dark brown macule   Assessment & Plan   Skin cancer screening performed today.  Actinic Damage - chronic, secondary to cumulative UV  radiation exposure/sun exposure over time - diffuse scaly erythematous macules with underlying dyspigmentation - Recommend daily broad spectrum sunscreen SPF 30+ to sun-exposed areas, reapply every 2 hours as needed.  - Call for new or changing lesions.  Lentigines - Scattered tan macules - Discussed due to sun exposure - Benign, observe - Recommend daily broad spectrum sunscreen SPF 30+ to sun-exposed areas, reapply every 2 hours as needed. - Call for any changes  Melanocytic Nevi - Tan-brown and/or pink-flesh-colored symmetric macules and papules. Multiple small brown macules of the legs. - Benign appearing on exam today - Observation - Call clinic for new or changing moles - Recommend daily use of broad spectrum spf 30+ sunscreen to sun-exposed areas.   Acrochordons (Skin Tags) - Fleshy, skin-colored pedunculated papules of the bil axilla - Benign appearing.  - Observe. - If desired, they can be removed with an in office procedure that is not covered by insurance. - Please call the clinic if you notice any new or changing lesions.  Dermatofibroma - Firm pink/brown papulenodule with dimple sign of the right lower knee - Benign appearing - Call for any changes  History of Dysplastic Nevi - No evidence of recurrence today - Recommend regular full body skin exams - Recommend daily broad spectrum sunscreen SPF 30+ to sun-exposed areas, reapply every 2 hours as needed.  - Call if any new or changing lesions are noted between office visits   Psoriasis bil ear canal, preauricular  Chronic condition, not at goal Continue Enstilar foam qd/bid to scalp prn. Pt has at home. Restart DermOtic oil qd/bid ear canals  prn. Pt has at home.  Psoriasis is a chronic non-curable, but treatable genetic/hereditary disease that may have other systemic features affecting other organ systems such as joints (Psoriatic Arthritis). It is associated with an increased risk of inflammatory bowel  disease, heart disease, non-alcoholic fatty liver disease, and depression.     Androgenetic alopecia Scalp  Chronic condition, Improving. Continue finasteride 5mg  qd as prescribed by her PCP. Start Rogaine 5% for women qhs.   Discussed red light caps/helmets    Neoplasm of uncertain behavior of skin (3) L lower neck  Epidermal / dermal shaving  Lesion diameter (cm):  0.7 Informed consent: discussed and consent obtained   Patient was prepped and draped in usual sterile fashion: Area prepped with alcohol. Anesthesia: the lesion was anesthetized in a standard fashion   Anesthetic:  1% lidocaine w/ epinephrine 1-100,000 buffered w/ 8.4% NaHCO3 Instrument used: flexible razor blade   Hemostasis achieved with: pressure, aluminum chloride and electrodesiccation   Outcome: patient tolerated procedure well   Post-procedure details: wound care instructions given   Post-procedure details comment:  Ointment and small bandage applied  Specimen 1 - Surgical pathology Differential Diagnosis: Irritated nevus vs other Check Margins: No 7.59mm pink brown fleshy papule    L medial shoulder  Epidermal / dermal shaving  Lesion diameter (cm):  0.6 Informed consent: discussed and consent obtained   Patient was prepped and draped in usual sterile fashion: Area prepped with alcohol. Anesthesia: the lesion was anesthetized in a standard fashion   Anesthetic:  1% lidocaine w/ epinephrine 1-100,000 buffered w/ 8.4% NaHCO3 Instrument used: flexible razor blade   Hemostasis achieved with: pressure, aluminum chloride and electrodesiccation   Outcome: patient tolerated procedure well   Post-procedure details: wound care instructions given   Post-procedure details comment:  Ointment and small bandage applied  Specimen 2 - Surgical pathology Differential Diagnosis: Irritated nevus vs other Check Margins: No 6.77mm pink brown papule  Left Abdomen  Epidermal / dermal shaving  Lesion diameter  (cm):  0.6 Informed consent: discussed and consent obtained   Patient was prepped and draped in usual sterile fashion: Area prepped with alcohol. Anesthesia: the lesion was anesthetized in a standard fashion   Anesthetic:  1% lidocaine w/ epinephrine 1-100,000 buffered w/ 8.4% NaHCO3 Instrument used: flexible razor blade   Hemostasis achieved with: pressure, aluminum chloride and electrodesiccation   Outcome: patient tolerated procedure well   Post-procedure details: wound care instructions given   Post-procedure details comment:  Ointment and small bandage applied  Specimen 3 - Surgical pathology Differential Diagnosis: Irritated Nevus vs other Check Margins: No 6.60mm dark brown papule  Nevus (4) Right Upper Arm; Right Upper Anterior Thigh; Left upper pretibia; Right 2nd Plantar Toe  Benign-appearing.  Observation.  Call clinic for new or changing moles.  Recommend daily use of broad spectrum spf 30+ sunscreen to sun-exposed areas.    Return in about 1 year (around 12/18/2021) for TBSE.   IJamesetta Orleans, CMA, am acting as scribe for Brendolyn Patty, MD .  Documentation: I have reviewed the above documentation for accuracy and completeness, and I agree with the above.  Brendolyn Patty MD

## 2020-12-18 NOTE — Patient Instructions (Addendum)
Fluocinolone (DermOtic) oil - Apply in and around ears 1-2 times a day until improved. Start Rogaine 5% for women.  Wound Care Instructions  1. Cleanse wound gently with soap and water once a day then pat dry with clean gauze. Apply a thing coat of Petrolatum (petroleum jelly, "Vaseline") over the wound (unless you have an allergy to this). We recommend that you use a new, sterile tube of Vaseline. Do not pick or remove scabs. Do not remove the yellow or white "healing tissue" from the base of the wound.  2. Cover the wound with fresh, clean, nonstick gauze and secure with paper tape. You may use Band-Aids in place of gauze and tape if the would is small enough, but would recommend trimming much of the tape off as there is often too much. Sometimes Band-Aids can irritate the skin.  3. You should call the office for your biopsy report after 1 week if you have not already been contacted.  4. If you experience any problems, such as abnormal amounts of bleeding, swelling, significant bruising, significant pain, or evidence of infection, please call the office immediately.

## 2020-12-19 ENCOUNTER — Telehealth: Payer: Self-pay

## 2020-12-19 NOTE — Telephone Encounter (Signed)
-----   Message from Brendolyn Patty, MD sent at 12/19/2020  5:34 PM EST ----- 1. Skin , left lower neck MELANOCYTIC NEVUS, INTRADERMAL TYPE 2. Skin , left medial shoulder MELANOCYTIC NEVUS, INTRADERMAL TYPE 3. Skin , left abdomen MELANOCYTIC NEVUS, INTRADERMAL TYPE  1-3 all benign moles

## 2020-12-19 NOTE — Telephone Encounter (Signed)
Advised pt of bx results/sh ?

## 2020-12-21 ENCOUNTER — Other Ambulatory Visit: Payer: Self-pay | Admitting: Internal Medicine

## 2020-12-21 DIAGNOSIS — L659 Nonscarring hair loss, unspecified: Secondary | ICD-10-CM

## 2021-03-12 ENCOUNTER — Other Ambulatory Visit: Payer: Self-pay

## 2021-03-12 ENCOUNTER — Other Ambulatory Visit (INDEPENDENT_AMBULATORY_CARE_PROVIDER_SITE_OTHER): Payer: BC Managed Care – PPO

## 2021-03-12 DIAGNOSIS — L409 Psoriasis, unspecified: Secondary | ICD-10-CM

## 2021-03-12 DIAGNOSIS — I10 Essential (primary) hypertension: Secondary | ICD-10-CM

## 2021-03-12 DIAGNOSIS — E8881 Metabolic syndrome: Secondary | ICD-10-CM

## 2021-03-12 MED ORDER — CEFDINIR 300 MG PO CAPS: 300.0000 mg | ORAL_CAPSULE | Freq: Two times a day (BID) | ORAL | 0 refills | Status: AC

## 2021-03-19 ENCOUNTER — Encounter: Payer: Self-pay | Admitting: Internal Medicine

## 2021-03-19 ENCOUNTER — Other Ambulatory Visit: Payer: Self-pay

## 2021-03-19 ENCOUNTER — Ambulatory Visit (INDEPENDENT_AMBULATORY_CARE_PROVIDER_SITE_OTHER): Payer: BC Managed Care – PPO | Admitting: Internal Medicine

## 2021-03-19 VITALS — BP 132/78 | HR 60 | Ht 63.0 in | Wt 171.2 lb

## 2021-03-19 DIAGNOSIS — L409 Psoriasis, unspecified: Secondary | ICD-10-CM

## 2021-03-19 DIAGNOSIS — L659 Nonscarring hair loss, unspecified: Secondary | ICD-10-CM | POA: Diagnosis not present

## 2021-03-19 DIAGNOSIS — Z1239 Encounter for other screening for malignant neoplasm of breast: Secondary | ICD-10-CM

## 2021-03-19 DIAGNOSIS — E8881 Metabolic syndrome: Secondary | ICD-10-CM | POA: Diagnosis not present

## 2021-03-19 DIAGNOSIS — Z0000 Encounter for general adult medical examination without abnormal findings: Secondary | ICD-10-CM | POA: Diagnosis not present

## 2021-03-19 DIAGNOSIS — I10 Essential (primary) hypertension: Secondary | ICD-10-CM | POA: Diagnosis not present

## 2021-03-19 NOTE — Assessment & Plan Note (Signed)

## 2021-03-19 NOTE — Assessment & Plan Note (Signed)
Blood pressure stable at the present time with present medication

## 2021-03-19 NOTE — Progress Notes (Signed)
Established Patient Office Visit  Subjective:  Patient ID: Krista Ferguson, female    DOB: 05-24-1976  Age: 45 y.o. MRN: [id number]  CC:  Chief Complaint  Patient presents with  . Annual Exam    HPI  Krista Ferguson presents for physical  Past Medical History:  Diagnosis Date  . Arthritis    Psosoratic  . Atypical mole    lower abd, lower back ?  Marland Kitchen GERD (gastroesophageal reflux disease)     Past Surgical History:  Procedure Laterality Date  . ABDOMINAL HYSTERECTOMY     Partial  . CESAREAN SECTION  1995  . CHOLECYSTECTOMY    . LAPAROSCOPIC GASTRIC SLEEVE RESECTION N/A 03/02/2018   Procedure: LAPAROSCOPIC GASTRIC SLEEVE RESECTION WITH HIATAL HERNIA REPAIR AND UPPER ENDOSCOPY;  Surgeon: Johnathan Hausen, MD;  Location: [facility];  Service: General;  Laterality: N/A;  . SINUSOTOMY  2011    Family History  Problem Relation Age of Onset  . Hypertension Other   . Diabetes Other   . Stroke Other   . Cancer Other   . Asthma Other   . COPD Other   . Breast cancer Maternal Aunt   . Breast cancer Maternal Grandmother     Social History   Socioeconomic History  . Marital status: Divorced    Spouse name: Not on file  . Number of children: Not on file  . Years of education: Not on file  . Highest education level: Not on file  Occupational History  . Not on file  Tobacco Use  . Smoking status: Never Smoker  . Smokeless tobacco: Never Used  Vaping Use  . Vaping Use: Never used  Substance and Sexual Activity  . Alcohol use: Yes    Comment: Rare-2 times a year  . Drug use: Never  . Sexual activity: Yes  Other Topics Concern  . Not on file  Social History Narrative  . Not on file   Social Determinants of Health   Financial Resource Strain: Not on file  Food Insecurity: Not on file  Transportation Needs: Not on file  Physical Activity: Not on file  Stress: Not on file  Social Connections: Not on file  Intimate Partner Violence: Not on file      Current Outpatient Medications:  .  Calcipotriene-Betameth Diprop 0.005-0.064 % FOAM, Apply 1 application topically daily as needed (for psoriasis). , Disp: , Rfl:  .  finasteride (PROSCAR) 5 MG tablet, TAKE 1 TABLET BY MOUTH EVERY DAY, Disp: 90 tablet, Rfl: 1 .  omeprazole (PRILOSEC) 20 MG capsule, TAKE 1 CAPSULE BY MOUTH EVERY DAY, Disp: 90 capsule, Rfl: 2   Allergies  Allergen Reactions  . Elemental Sulfur Hives  . Levaquin [Levofloxacin In D5w] Hives  . Sulfa Antibiotics Hives    ROS Review of Systems  Constitutional: Negative.   HENT: Negative.   Eyes: Negative.   Respiratory: Negative.   Cardiovascular: Negative.   Gastrointestinal: Negative.   Endocrine: Negative.   Genitourinary: Negative.  Negative for genital sores.  Musculoskeletal: Positive for arthralgias (psoriatic).  Skin: Negative.   Allergic/Immunologic: Negative.   Neurological: Negative.   Hematological: Negative.   Psychiatric/Behavioral: Negative.   All other systems reviewed and are negative.     Objective:    Physical Exam Vitals reviewed.  Constitutional:      Appearance: Normal appearance.  HENT:     Mouth/Throat:     Mouth: Mucous membranes are moist.  Eyes:     Pupils: Pupils are equal, round,  and reactive to light.  Neck:     Vascular: No carotid bruit.  Cardiovascular:     Rate and Rhythm: Normal rate and regular rhythm.     Pulses: Normal pulses.     Heart sounds: Normal heart sounds.  Pulmonary:     Effort: Pulmonary effort is normal.     Breath sounds: Normal breath sounds.  Abdominal:     General: Bowel sounds are normal.     Palpations: Abdomen is soft. There is no hepatomegaly, splenomegaly or mass.     Tenderness: There is no abdominal tenderness.     Hernia: No hernia is present.  Musculoskeletal:        General: No tenderness.     Cervical back: Neck supple.     Right lower leg: No edema.     Left lower leg: No edema.  Skin:    Findings: No rash.   Neurological:     Mental Status: She is alert and oriented to person, place, and time.     Motor: No weakness.  Psychiatric:        Mood and Affect: Mood and affect normal.        Behavior: Behavior normal.     BP 132/78   Pulse 60   Ht 5\' 3"  (1.6 m)   Wt 171 lb 3.2 oz (77.7 kg)   BMI 30.33 kg/m  Wt Readings from Last 3 Encounters:  03/19/21 171 lb 3.2 oz (77.7 kg)  09/05/20 177 lb (80.3 kg)  08/18/20 177 lb 3.2 oz (80.4 kg)     Health Maintenance Due  Topic Date Due  . Hepatitis C Screening  Never done  . COVID-19 Vaccine (1) Never done  . HIV Screening  Never done  . PAP SMEAR-Modifier  Never done    There are no preventive care reminders to display for this patient.  Lab Results  Component Value Date   TSH 1.07 03/12/2021   Lab Results  Component Value Date   WBC 6.7 03/12/2021   HGB 13.4 03/12/2021   HCT 40.3 03/12/2021   MCV 92.9 03/12/2021   PLT 403 (H) 03/12/2021   Lab Results  Component Value Date   NA 140 03/12/2021   K 4.2 03/12/2021   CO2 27 03/12/2021   GLUCOSE 95 03/12/2021   BUN 6 (L) 03/12/2021   CREATININE 0.68 03/12/2021   BILITOT 0.5 03/12/2021   ALKPHOS 58 03/02/2018   AST 19 03/12/2021   ALT 19 03/12/2021   PROT 7.4 03/12/2021   ALBUMIN 4.0 03/02/2018   CALCIUM 9.3 03/12/2021   ANIONGAP 11 03/02/2018   Lab Results  Component Value Date   CHOL 201 (H) 03/12/2021   Lab Results  Component Value Date   HDL 50 03/12/2021   Lab Results  Component Value Date   LDLCALC 129 (H) 03/12/2021   Lab Results  Component Value Date   TRIG 110 03/12/2021   Lab Results  Component Value Date   CHOLHDL 4.0 03/12/2021   No results found for: HGBA1C    Assessment & Plan:   Problem List Items Addressed This Visit      Cardiovascular and Mediastinum   Essential hypertension    Blood pressure stable at the present time with present medication        Musculoskeletal and Integument   Psoriasis, unspecified    Stable at present  time      Alopecia    Patient is using minoxidil and light on the head and  it is helping her        Other   Metabolic syndrome    - I encouraged the patient to lose weight.  - I educated them on making healthy dietary choices including eating more fruits and vegetables and less fried foods. - I encouraged the patient to exercise more, and educated on the benefits of exercise including weight loss, diabetes prevention, and hypertension prevention.   Dietary counseling with a registered dietician  Referral to a weight management support group (e.g. Weight Watchers, Overeaters Anonymous)  If your BMI is greater than 29 or you have gained more than 15 pounds you should work on weight loss.  Attend a healthy cooking class       Annual physical exam - Primary    General physical examination is unremarkable.  Patient has a complete hysterectomy.  So we will check CA125.  Her labs are reviewed with the patient.         No orders of the defined types were placed in this encounter.   Follow-up: No follow-ups on file.    Cletis Athens, MD

## 2021-03-19 NOTE — Assessment & Plan Note (Signed)
Patient is using minoxidil and light on the head and it is helping her

## 2021-03-19 NOTE — Addendum Note (Signed)
Addended by: Alois Cliche on: 03/19/2021 09:51 AM   Modules accepted: Orders

## 2021-03-19 NOTE — Assessment & Plan Note (Signed)
Stable at present time

## 2021-03-19 NOTE — Assessment & Plan Note (Signed)
General physical examination is unremarkable.  Patient has a complete hysterectomy.  So we will check CA125.  Her labs are reviewed with the patient.

## 2021-03-20 LAB — CBC WITH DIFFERENTIAL/PLATELET
Absolute Monocytes: 489 cells/uL (ref 200–950)
Basophils Absolute: 60 cells/uL (ref 0–200)
Basophils Relative: 0.9 %
Eosinophils Absolute: 60 cells/uL (ref 15–500)
Eosinophils Relative: 0.9 %
HCT: 40.3 % (ref 35.0–45.0)
Hemoglobin: 13.4 g/dL (ref 11.7–15.5)
Lymphs Abs: 1883 cells/uL (ref 850–3900)
MCH: 30.9 pg (ref 27.0–33.0)
MCHC: 33.3 g/dL (ref 32.0–36.0)
MCV: 92.9 fL (ref 80.0–100.0)
MPV: 9.7 fL (ref 7.5–12.5)
Monocytes Relative: 7.3 %
Neutro Abs: 4208 cells/uL (ref 1500–7800)
Neutrophils Relative %: 62.8 %
Platelets: 403 10*3/uL — ABNORMAL HIGH (ref 140–400)
RBC: 4.34 10*6/uL (ref 3.80–5.10)
RDW: 12.1 % (ref 11.0–15.0)
Total Lymphocyte: 28.1 %
WBC: 6.7 10*3/uL (ref 3.8–10.8)

## 2021-03-20 LAB — COMPLETE METABOLIC PANEL WITH GFR
AG Ratio: 1.3 (calc) (ref 1.0–2.5)
ALT: 19 U/L (ref 6–29)
AST: 19 U/L (ref 10–30)
Albumin: 4.2 g/dL (ref 3.6–5.1)
Alkaline phosphatase (APISO): 74 U/L (ref 31–125)
BUN/Creatinine Ratio: 9 (calc) (ref 6–22)
BUN: 6 mg/dL — ABNORMAL LOW (ref 7–25)
CO2: 27 mmol/L (ref 20–32)
Calcium: 9.3 mg/dL (ref 8.6–10.2)
Chloride: 102 mmol/L (ref 98–110)
Creat: 0.68 mg/dL (ref 0.50–1.10)
GFR, Est African American: 123 mL/min/{1.73_m2} (ref >=60)
GFR, Est Non African American: 106 mL/min/{1.73_m2} (ref >=60)
Globulin: 3.2 g/dL (calc) (ref 1.9–3.7)
Glucose, Bld: 95 mg/dL (ref 65–99)
Potassium: 4.2 mmol/L (ref 3.5–5.3)
Sodium: 140 mmol/L (ref 135–146)
Total Bilirubin: 0.5 mg/dL (ref 0.2–1.2)
Total Protein: 7.4 g/dL (ref 6.1–8.1)

## 2021-03-20 LAB — TEST AUTHORIZATION

## 2021-03-20 LAB — LIPID PANEL
Cholesterol: 201 mg/dL — ABNORMAL HIGH (ref <200)
HDL: 50 mg/dL (ref >=50)
LDL Cholesterol (Calc): 129 mg/dL (calc) — ABNORMAL HIGH
Non-HDL Cholesterol (Calc): 151 mg/dL (calc) — ABNORMAL HIGH (ref <130)
Total CHOL/HDL Ratio: 4 (calc) (ref <5.0)
Triglycerides: 110 mg/dL (ref <150)

## 2021-03-20 LAB — TSH: TSH: 1.07 mIU/L

## 2021-03-20 LAB — CA 125: CA 125: 5 U/mL (ref <35)

## 2021-03-30 ENCOUNTER — Other Ambulatory Visit: Payer: Self-pay

## 2021-03-30 ENCOUNTER — Ambulatory Visit
Admission: RE | Admit: 2021-03-30 | Discharge: 2021-03-30 | Disposition: A | Discharge status: Home / Self Care | Payer: BC Managed Care – PPO | Source: Ambulatory Visit | Attending: Internal Medicine | Admitting: Internal Medicine

## 2021-03-30 DIAGNOSIS — Z1231 Encounter for screening mammogram for malignant neoplasm of breast: Secondary | ICD-10-CM | POA: Insufficient documentation

## 2021-03-30 DIAGNOSIS — Z1239 Encounter for other screening for malignant neoplasm of breast: Secondary | ICD-10-CM

## 2021-05-05 ENCOUNTER — Other Ambulatory Visit: Payer: Self-pay | Admitting: Internal Medicine

## 2021-06-12 ENCOUNTER — Other Ambulatory Visit: Payer: Self-pay | Admitting: Internal Medicine

## 2021-06-12 ENCOUNTER — Other Ambulatory Visit: Payer: Self-pay | Admitting: *Deleted

## 2021-06-12 DIAGNOSIS — L659 Nonscarring hair loss, unspecified: Secondary | ICD-10-CM

## 2021-06-12 MED ORDER — AZITHROMYCIN 250 MG PO TABS: ORAL_TABLET | ORAL | 0 refills | Status: AC

## 2021-09-27 ENCOUNTER — Encounter (HOSPITAL_COMMUNITY): Payer: Self-pay | Admitting: *Deleted

## 2021-12-06 ENCOUNTER — Other Ambulatory Visit: Payer: Self-pay | Admitting: Internal Medicine

## 2021-12-06 DIAGNOSIS — L659 Nonscarring hair loss, unspecified: Secondary | ICD-10-CM

## 2021-12-24 ENCOUNTER — Other Ambulatory Visit: Payer: Self-pay

## 2021-12-24 ENCOUNTER — Ambulatory Visit: Payer: BC Managed Care – PPO | Admitting: Dermatology

## 2021-12-24 ENCOUNTER — Encounter: Payer: Self-pay | Admitting: Dermatology

## 2021-12-24 DIAGNOSIS — D224 Melanocytic nevi of scalp and neck: Secondary | ICD-10-CM | POA: Diagnosis not present

## 2021-12-24 DIAGNOSIS — L821 Other seborrheic keratosis: Secondary | ICD-10-CM

## 2021-12-24 DIAGNOSIS — L918 Other hypertrophic disorders of the skin: Secondary | ICD-10-CM

## 2021-12-24 DIAGNOSIS — L409 Psoriasis, unspecified: Secondary | ICD-10-CM | POA: Diagnosis not present

## 2021-12-24 DIAGNOSIS — L649 Androgenic alopecia, unspecified: Secondary | ICD-10-CM | POA: Diagnosis not present

## 2021-12-24 DIAGNOSIS — D2261 Melanocytic nevi of right upper limb, including shoulder: Secondary | ICD-10-CM | POA: Diagnosis not present

## 2021-12-24 DIAGNOSIS — D225 Melanocytic nevi of trunk: Secondary | ICD-10-CM

## 2021-12-24 DIAGNOSIS — L578 Other skin changes due to chronic exposure to nonionizing radiation: Secondary | ICD-10-CM | POA: Diagnosis not present

## 2021-12-24 DIAGNOSIS — D1800 Hemangioma unspecified site: Secondary | ICD-10-CM

## 2021-12-24 DIAGNOSIS — Z1283 Encounter for screening for malignant neoplasm of skin: Secondary | ICD-10-CM

## 2021-12-24 DIAGNOSIS — L814 Other melanin hyperpigmentation: Secondary | ICD-10-CM

## 2021-12-24 DIAGNOSIS — D229 Melanocytic nevi, unspecified: Secondary | ICD-10-CM

## 2021-12-24 DIAGNOSIS — Z86018 Personal history of other benign neoplasm: Secondary | ICD-10-CM

## 2021-12-24 DIAGNOSIS — D485 Neoplasm of uncertain behavior of skin: Secondary | ICD-10-CM

## 2021-12-24 MED ORDER — VTAMA 1 % EX CREA: 1.0000 "application " | TOPICAL_CREAM | Freq: Two times a day (BID) | CUTANEOUS | 2 refills | Status: DC

## 2021-12-24 MED ORDER — MINOXIDIL 2.5 MG PO TABS: 2.5000 mg | ORAL_TABLET | Freq: Every day | ORAL | 1 refills | Status: DC

## 2021-12-24 NOTE — Progress Notes (Signed)
Follow-Up Visit   Subjective  Krista Ferguson is a 46 y.o. female who presents for the following: Annual Exam.  The patient presents for Total-Body Skin Exam (TBSE) for skin cancer screening and mole check.  The patient has spots, moles and lesions to be evaluated, some may be new or changing. She has a few irritated moles she would like removed on the right neck, right upper arm, and right antecubital. She has a history of dysplastic nevi. Patient has androgenetic alopecia, treating with 5 mg finasteride from PCP.  She also uses the red light helmet and 5% Rogaine qhs and she thinks she is improving a little.  No side effects from the finasteride.  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  She uses Enstilar foam and Dermotic drops as needed.  The following portions of the chart were reviewed this encounter and updated as appropriate:   Tobacco   Allergies   Meds   Problems   Med Hx   Surg Hx   Fam Hx       Review of Systems:  No other skin or systemic complaints except as noted in HPI or Assessment and Plan.  Objective  Well appearing patient in no apparent distress; mood and affect are within normal limits.  A full examination was performed including scalp, head, eyes, ears, nose, lips, neck, chest, axillae, abdomen, back, buttocks, bilateral upper extremities, bilateral lower extremities, hands, feet, fingers, toes, fingernails, and toenails. All findings within normal limits unless otherwise noted below.  trunk, extremities Right 2nd Plantar Toe: 4.27mm brown macule   Left upper pretibia: 3.91mm brown macule slight irreg border    Right Upper Anterior Thigh: 4.42mm medium dark brown macule  L posterior shoulder medial: 6.84mm pink flesh papule, irritated by bra  L flank: 8.22mm brown papule      ears Pink scaliness of the right ear posterior, anterior, canal, and helix  Scalp Diffuse thinning of the crown and widening of the midline part  with retention of the frontal hairline - Reviewed progressive nature and prognosis.   4/22 TSH nl             right neck 6.68mm pink brown papule  Right Anterior Shoulder Superior 4.26mm pink brown papule    Right Anterior Shoulder Inferior 7.41mm brown papule slightly waxy Inev vs ISK  Right Antecubital Fossa 5.36mm fleshy brown papule    Assessment & Plan   Skin cancer screening performed today.  Actinic Damage - chronic, secondary to cumulative UV radiation exposure/sun exposure over time - diffuse scaly erythematous macules with underlying dyspigmentation - Recommend daily broad spectrum sunscreen SPF 30+ to sun-exposed areas, reapply every 2 hours as needed.  - Recommend staying in the shade or wearing long sleeves, sun glasses (UVA+UVB protection) and wide brim hats (4-inch brim around the entire circumference of the hat). - Call for new or changing lesions.  Lentigines - Scattered tan macules - Due to sun exposure - Benign-appering, observe - Recommend daily broad spectrum sunscreen SPF 30+ to sun-exposed areas, reapply every 2 hours as needed. - Call for any changes  Melanocytic Nevi - Tan-brown and/or pink-flesh-colored symmetric macules and papules - Benign appearing on exam today - Observation - Call clinic for new or changing moles - Recommend daily use of broad spectrum spf 30+ sunscreen to sun-exposed areas.   History of Dysplastic Nevi - No evidence of recurrence today - Recommend regular full body skin exams - Recommend daily broad  spectrum sunscreen SPF 30+ to sun-exposed areas, reapply every 2 hours as needed.  - Call if any new or changing lesions are noted between office visits  Seborrheic Keratoses - Stuck-on, waxy, tan-brown papules and/or plaques  - Benign-appearing - Discussed benign etiology and prognosis. - Observe - Call for any changes  Hemangiomas - Red papules - Discussed benign nature - Observe - Call for any  changes  Acrochordons (Skin Tags) - Fleshy, skin-colored pedunculated papules - Benign appearing.  - Observe. - If desired, they can be removed with an in office procedure that is not covered by insurance. - Please call the clinic if you notice any new or changing lesions.  Nevus trunk, extremities  Benign-appearing.  Stable. Observation.  Call clinic for new or changing moles.  Recommend daily use of broad spectrum spf 30+ sunscreen to sun-exposed areas.   Recheck L post shoulder on f/up  Psoriasis ears  Psoriasis is a chronic non-curable, but treatable genetic/hereditary disease that may have other systemic features affecting other organ systems such as joints (Psoriatic Arthritis). It is associated with an increased risk of inflammatory bowel disease, heart disease, non-alcoholic fatty liver disease, and depression.    Start Vtama Cream Apply to AA ears qd prn dsp 60g 2Rf. Lot No [id number] Exp 11/2022. Continue Enstilar Foam qd prn severe flares. Pt has at home Continue Dermotic Oil qd/bid prn itch. Pt has at home.   Tapinarof (VTAMA) 1 % CREA - ears Apply 1 application topically in the morning and at bedtime. Apply to affected area ears as needed for psoriasis.  Androgenetic alopecia Scalp  Androgenic alopecia is a chronic condition related to genetics and/or hormonal changes associated with menopause in women causing hair thinning primarily on the crown with widening of the part and temporal hairline recession.  Can use OTC Rogaine (minoxidil) 5% solution/foam as directed or take low dose oral minoxidil.  BP 100/64 Continue Finasteride 5mg  QD as prescribed by PCP Start Minoxidil 2.5mg  take 1/2 tablet daily (Rx written 1 po QD) dsp #30 1Rf.  d/c topical Rogaine Continue red light therapy as directed (3x/wk)    minoxidil (LONITEN) 2.5 MG tablet - Scalp Take 1 tablet (2.5 mg total) by mouth daily.  Neoplasm of uncertain behavior of skin (4) right neck  Epidermal / dermal  shaving  Lesion diameter (cm):  0.6 Informed consent: discussed and consent obtained   Patient was prepped and draped in usual sterile fashion: Area prepped with alcohol. Anesthesia: the lesion was anesthetized in a standard fashion   Anesthetic:  0.5% bupivicaine w/ epinephrine 1-100,000 local infiltration Instrument used: flexible razor blade   Hemostasis achieved with: pressure, aluminum chloride and electrodesiccation   Outcome: patient tolerated procedure well   Post-procedure details: wound care instructions given   Post-procedure details comment:  Ointment and small bandage applied  Specimen 1 - Surgical pathology Differential Diagnosis: Irritated Nevus vs other Check Margins: No 6.9mm pink brown papule  Right Anterior Shoulder Superior  Epidermal / dermal shaving  Lesion diameter (cm):  0.4 Informed consent: discussed and consent obtained   Patient was prepped and draped in usual sterile fashion: Area prepped with alcohol. Anesthesia: the lesion was anesthetized in a standard fashion   Anesthetic:  0.5% bupivicaine w/ epinephrine 1-100,000 local infiltration Instrument used: flexible razor blade   Hemostasis achieved with: pressure, aluminum chloride and electrodesiccation   Outcome: patient tolerated procedure well   Post-procedure details: wound care instructions given   Post-procedure details comment:  Ointment and small bandage  applied  Specimen 2 - Surgical pathology Differential Diagnosis: Irritated Nevus vs Irritated SK vs other Check Margins: No 4.25mm pink brown papule  Right Anterior Shoulder Inferior  Epidermal / dermal shaving  Lesion diameter (cm):  0.7 Informed consent: discussed and consent obtained   Patient was prepped and draped in usual sterile fashion: Area prepped with alcohol. Anesthesia: the lesion was anesthetized in a standard fashion   Anesthetic:  0.5% bupivicaine w/ epinephrine 1-100,000 local infiltration Instrument used: flexible  razor blade   Hemostasis achieved with: pressure, aluminum chloride and electrodesiccation   Outcome: patient tolerated procedure well   Post-procedure details: wound care instructions given   Post-procedure details comment:  Ointment and small bandage applied  Specimen 3 - Surgical pathology Differential Diagnosis: Irritated Nevus vs Irritated SK vs other Check Margins: No 7.3mm tan papule slightly waxy   Right Antecubital Fossa  Epidermal / dermal shaving  Lesion diameter (cm):  0.5 Informed consent: discussed and consent obtained   Patient was prepped and draped in usual sterile fashion: Area prepped with alcohol. Anesthesia: the lesion was anesthetized in a standard fashion   Anesthetic:  0.5% bupivicaine w/ epinephrine 1-100,000 local infiltration Instrument used: flexible razor blade   Hemostasis achieved with: pressure, aluminum chloride and electrodesiccation   Outcome: patient tolerated procedure well   Post-procedure details: wound care instructions given   Post-procedure details comment:  Ointment and small bandage applied  Specimen 4 - Surgical pathology Differential Diagnosis: Irritated Nevus vs other Check Margins: No 5.42mm fleshy brown papule   Return in about 3 months (around 03/24/2022) for alopecia, recheck L post shoulder.  IJamesetta Orleans, CMA, am acting as scribe for Brendolyn Patty, MD . Documentation: I have reviewed the above documentation for accuracy and completeness, and I agree with the above.  Brendolyn Patty MD

## 2021-12-24 NOTE — Patient Instructions (Addendum)
Loniten (minoxidil) - take 1/2 tablet by mouth daily. (Prescription will say to take 1 tablet daily)  Wound Care Instructions  Cleanse wound gently with soap and water once a day then pat dry with clean gauze. Apply a thing coat of Petrolatum (petroleum jelly, "Vaseline") over the wound (unless you have an allergy to this). We recommend that you use a new, sterile tube of Vaseline. Do not pick or remove scabs. Do not remove the yellow or white "healing tissue" from the base of the wound.  Cover the wound with fresh, clean, nonstick gauze and secure with paper tape. You may use Band-Aids in place of gauze and tape if the would is small enough, but would recommend trimming much of the tape off as there is often too much. Sometimes Band-Aids can irritate the skin.  You should call the office for your biopsy report after 1 week if you have not already been contacted.  If you experience any problems, such as abnormal amounts of bleeding, swelling, significant bruising, significant pain, or evidence of infection, please call the office immediately.  FOR ADULT SURGERY PATIENTS: If you need something for pain relief you may take 1 extra strength Tylenol (acetaminophen) AND 2 Ibuprofen (200mg  each) together every 4 hours as needed for pain. (do not take these if you are allergic to them or if you have a reason you should not take them.) Typically, you may only need pain medication for 1 to 3 days.      If You Need Anything After Your Visit  If you have any questions or concerns for your doctor, please call our main line at [phone] and press option 4 to reach your doctor's medical assistant. If no one answers, please leave a voicemail as directed and we will return your call as soon as possible. Messages left after 4 pm will be answered the following business day.   You may also send [place] a message via [place]. We typically respond to MyChart messages within 1-2 business days.  For prescription  refills, please ask your pharmacy to contact our office. Our fax number is [phone].  If you have an urgent issue when the clinic is closed that cannot wait until the next business day, you can page your doctor at the number below.    Please note that while we do our best to be available for urgent issues outside of office hours, we are not available 24/7.   If you have an urgent issue and are unable to reach [place], you may choose to seek medical care at your doctor's office, retail clinic, urgent care center, or emergency room.  If you have a medical emergency, please immediately call 911 or go to the emergency department.  Pager Numbers  - Dr. Nehemiah Massed: [phone]  - Dr. Laurence Ferrari: [phone]  - Dr. Nicole Kindred: [phone]  In the event of inclement weather, please call our main line at [phone] for an update on the status of any delays or closures.  Dermatology Medication Tips: Please keep the boxes that topical medications come in in order to help keep track of the instructions about where and how to use these. Pharmacies typically print the medication instructions only on the boxes and not directly on the medication tubes.   If your medication is too expensive, please contact our office at [phone] option 4 or send [place] a message through [place].   We are unable to tell what your co-pay for medications will be in advance as this is different  depending on your insurance coverage. However, we may be able to find a substitute medication at lower cost or fill out paperwork to get insurance to cover a needed medication.   If a prior authorization is required to get your medication covered by your insurance company, please allow [place] 1-2 business days to complete this process.  Drug prices often vary depending on where the prescription is filled and some pharmacies may offer cheaper prices.  The website www.goodrx.com contains coupons for medications through different pharmacies. The  prices here do not account for what the cost may be with help from insurance (it may be cheaper with your insurance), but the website can give you the price if you did not use any insurance.  - You can print the associated coupon and take it with your prescription to the pharmacy.  - You may also stop by our office during regular business hours and pick up a GoodRx coupon card.  - If you need your prescription sent electronically to a different pharmacy, notify our office through [facility] or by phone at [phone] option 4.     Si Usted Necesita Algo Despus de Su Visita  Tambin puede enviarnos un mensaje a travs de Pharmacist, community. Por lo general respondemos a los mensajes de MyChart en el transcurso de 1 a 2 das hbiles.  Para renovar recetas, por favor pida a su farmacia que se ponga en contacto con nuestra oficina. Harland Dingwall de fax es [place] [phone].  Si tiene un asunto urgente cuando la clnica est cerrada y que no puede esperar hasta el siguiente da hbil, puede llamar/localizar a su doctor(a) al nmero que aparece a continuacin.   Por favor, tenga en cuenta que aunque hacemos todo lo posible para estar disponibles para asuntos urgentes fuera del horario de [place], no estamos disponibles las 24 horas del da, los 7 das de la [place].   Si tiene un problema urgente y no puede comunicarse con nosotros, puede optar por buscar atencin mdica  en el consultorio de su doctor(a), en una clnica privada, en un centro de atencin urgente o en una sala de emergencias.  Si tiene Engineering geologist, por favor llame inmediatamente al 911 o vaya a la sala de emergencias.  Nmeros de bper  - Dr. Nehemiah Massed: [phone]  - Dra. Moye: [phone]  - Dra. Nicole Kindred: [phone]  En caso de inclemencias del [place], por favor llame a Johnsie Kindred principal al [phone] para una actualizacin sobre el [place] de cualquier retraso o cierre.  Consejos para la medicacin en  dermatologa: Por favor, guarde las cajas en las que vienen los medicamentos de uso tpico para ayudarle a seguir las instrucciones sobre dnde y cmo usarlos. Las farmacias generalmente imprimen las instrucciones del medicamento slo en las cajas y no directamente en los tubos del [place].   Si su medicamento es muy caro, por favor, pngase en contacto con Zigmund Daniel llamando al [phone] y presione la opcin 4 o envenos un mensaje a travs de Pharmacist, community.   No podemos decirle cul ser su copago por los medicamentos por adelantado ya que esto es diferente dependiendo de la cobertura de su seguro. Sin embargo, es posible que podamos encontrar un medicamento sustituto a Electrical engineer un formulario para que el seguro cubra el medicamento que se considera necesario.   Si se requiere una autorizacin previa para que su compaa de seguros [place] su medicamento, por favor permtanos de 1 a 2 das hbiles para [employer]  proceso.  Los precios de los medicamentos varan con frecuencia dependiendo del Environmental consultant de dnde se surte la receta y alguna farmacias pueden ofrecer precios ms baratos.  El sitio web www.goodrx.com tiene cupones para medicamentos de Airline pilot. Los precios aqu no tienen en cuenta lo que podra costar con la ayuda del seguro (puede ser ms barato con su seguro), pero el sitio web puede darle el precio si no utiliz Research scientist (physical sciences).  - Puede imprimir el cupn correspondiente y llevarlo con su receta a la farmacia.  - Tambin puede pasar por nuestra oficina durante el horario de atencin regular y Charity fundraiser una tarjeta de cupones de GoodRx.  - Si necesita que su receta se enve electrnicamente a una farmacia diferente, informe a nuestra oficina a travs de MyChart de [place] o por telfono llamando al [phone] y presione la opcin 4.

## 2021-12-25 ENCOUNTER — Telehealth: Payer: Self-pay

## 2021-12-25 NOTE — Telephone Encounter (Signed)
-----   Message from Brendolyn Patty, MD sent at 12/25/2021  3:40 PM EST ----- 1. Skin , right neck MELANOCYTIC NEVUS, INTRADERMAL TYPE, BASE INVOLVED 2. Skin , right anterior shoulder superior MELANOCYTIC NEVUS, INTRADERMAL TYPE, BASE INVOLVED 3. Skin , right anterior shoulder inferior MELANOCYTIC NEVUS, INTRADERMAL TYPE, BASE INVOLVED 4. Skin , right antecubital fossa MELANOCYTIC NEVUS, INTRADERMAL TYPE, BASE INVOLVED  1-4. Benign moles - please call patient

## 2021-12-25 NOTE — Telephone Encounter (Signed)
Left pf message to call for bx result/sh

## 2021-12-26 NOTE — Telephone Encounter (Signed)
Spoke with pt and informed her of results. She had no concerns.

## 2022-01-15 ENCOUNTER — Other Ambulatory Visit: Payer: Self-pay | Admitting: Dermatology

## 2022-01-15 DIAGNOSIS — L649 Androgenic alopecia, unspecified: Secondary | ICD-10-CM

## 2022-01-18 ENCOUNTER — Other Ambulatory Visit: Payer: Self-pay | Admitting: Internal Medicine

## 2022-03-16 ENCOUNTER — Other Ambulatory Visit: Payer: Self-pay | Admitting: Dermatology

## 2022-03-16 DIAGNOSIS — L649 Androgenic alopecia, unspecified: Secondary | ICD-10-CM

## 2022-03-18 ENCOUNTER — Ambulatory Visit: Payer: BC Managed Care – PPO | Admitting: Dermatology

## 2022-03-18 DIAGNOSIS — D225 Melanocytic nevi of trunk: Secondary | ICD-10-CM | POA: Diagnosis not present

## 2022-03-18 DIAGNOSIS — D2262 Melanocytic nevi of left upper limb, including shoulder: Secondary | ICD-10-CM

## 2022-03-18 DIAGNOSIS — D492 Neoplasm of unspecified behavior of bone, soft tissue, and skin: Secondary | ICD-10-CM

## 2022-03-18 DIAGNOSIS — L649 Androgenic alopecia, unspecified: Secondary | ICD-10-CM | POA: Diagnosis not present

## 2022-03-18 DIAGNOSIS — L409 Psoriasis, unspecified: Secondary | ICD-10-CM | POA: Diagnosis not present

## 2022-03-18 MED ORDER — MINOXIDIL 2.5 MG PO TABS: 2.5000 mg | ORAL_TABLET | Freq: Every day | ORAL | 5 refills | Status: DC

## 2022-03-18 MED ORDER — KETOCONAZOLE 2 % EX SHAM: 1.0000 "application " | MEDICATED_SHAMPOO | CUTANEOUS | 6 refills | Status: DC

## 2022-03-18 NOTE — Progress Notes (Signed)
? ?Follow-Up Visit ?  ?Subjective  ?Krista Ferguson is a 46 y.o. female who presents for the following: Androgenetic Alopecia (Scalp, Finasteride '5mg'$  1 po qd, Minoxidil 2.'5mg'$  1/2 po qd, red light therapy, pt has not noticed any changes) and Nevus (Recheck, L post shoulder shoulder medial, ). No side effects from oral medication ? ?The patient has spots, moles and lesions to be evaluated, some may be new or changing and the patient has concerns that these could be cancer. ? ? ?The following portions of the chart were reviewed this encounter and updated as appropriate:  ?  ?  ? ?Review of Systems:  No other skin or systemic complaints except as noted in HPI or Assessment and Plan. ? ?Objective  ?Well appearing patient in no apparent distress; mood and affect are within normal limits. ? ?A focused examination was performed including scalp, L post shoulder. Relevant physical exam findings are noted in the Assessment and Plan. ? ?Scalp ?Diffuse thinning of the crown and widening of the midline part with retention of the frontal hairline. Viewed photos, some improvement with slight narrowing of midline part ? ?L post medial shoulder ?7.0 x 5.0 slightly firm pap derm vs nevus r/o BCC ? ? ? ? ?L paraspinal upper back ?6.11m pink pap Irritated nevus vs other ? ? ? ? ?ears, scalp ?Clear today. ? ? ? ?Assessment & Plan  ?Androgenetic alopecia ?Scalp ? ?Chronic and persistent condition with duration or expected duration over one year. Condition is symptomatic/ bothersome to patient. Not currently at goal. Some mild improvement. ? ?Androgenetic Alopecia (or Female pattern hair loss) refers to the common patterned hair loss affecting many men.  Female pattern alopecia is mediated by dihydrotestosterone which induces miniaturization of androgen-sensitive hair follicles.  It is chronic and persistent, but treatable; not curable. ?Topical treatment includes: ?- 5% topical Minoxidil ?Oral treatment includes: ?- Finasteride 1 mg  qd ?- Minoxidil 1.25 - 5 mg qd ?- Dutasteride 0.5 mg qd ?Adjunct therapy includes: ?- Low Level Laser Light Therapy (LLLT) ?- Platelet-rich Plasma injections (PRP) ?- Hair Transplantation or scalp reduction  ? ?BP today 132/81 ? ?Increase Minoxidil 2.'5mg'$  from 1/2 tab to 1 po qd ?Cont Finasteride '5mg'$  1 po qd (PCP is prescribing) ?Cont Red light therapy 2x/week.  Pt uses IRestore helmet ? ?minoxidil (LONITEN) 2.5 MG tablet - Scalp ?Take 1 tablet (2.5 mg total) by mouth daily. ? ?Neoplasm of skin (2) ?L post medial shoulder ? ?Epidermal / dermal shaving ? ?Lesion diameter (cm):  0.7 ?Informed consent: discussed and consent obtained   ?Patient was prepped and draped in usual sterile fashion: area prepped with alcohol. ?Anesthesia: the lesion was anesthetized in a standard fashion   ?Anesthetic:  1% lidocaine w/ epinephrine 1-100,000 buffered w/ 8.4% NaHCO3 ?Instrument used: flexible razor blade   ?Hemostasis achieved with: pressure, aluminum chloride and electrodesiccation   ?Outcome: patient tolerated procedure well   ?Post-procedure details: wound care instructions given   ?Post-procedure details comment:  Ointment and small bandage applied ? ?Specimen 1 - Surgical pathology ?Differential Diagnosis: D48.5 Dermatofibroma vs nevus r/o BCC ? ?Check Margins: yes ?7.0 x 5.0 slightly firm pap  ? ?L paraspinal upper back ? ?Epidermal / dermal shaving ? ?Lesion diameter (cm):  0.6 ?Informed consent: discussed and consent obtained   ?Patient was prepped and draped in usual sterile fashion: area prepped with alcohol. ?Anesthesia: the lesion was anesthetized in a standard fashion   ?Anesthetic:  1% lidocaine w/ epinephrine 1-100,000 buffered w/ 8.4% NaHCO3 ?  Instrument used: flexible razor blade   ?Hemostasis achieved with: pressure, aluminum chloride and electrodesiccation   ?Outcome: patient tolerated procedure well   ?Post-procedure details: wound care instructions given   ?Post-procedure details comment:  Ointment and small  bandage applied ? ?Specimen 2 - Surgical pathology ?Differential Diagnosis: D48.5 Irritated Nevus r/o Atypia ? ?Check Margins: yes ?6.57m pink pap  ? ?Psoriasis ?ears, scalp ? ?Chronic condition with duration or expected duration over one year. Currently well-controlled.  ? ?Psoriasis is a chronic non-curable, but treatable genetic/hereditary disease that may have other systemic features affecting other organ systems such as joints (Psoriatic Arthritis). It is associated with an increased risk of inflammatory bowel disease, heart disease, non-alcoholic fatty liver disease, and depression.    ? ?Start Ketoconazole 2% shampoo 2x/wk, let sit 5 minutes and rinse out ?Cont Vtama cr bid to aa prn ears ? ?ketoconazole (NIZORAL) 2 % shampoo - ears, scalp ?Apply 1 application. topically 2 (two) times a week. Wash scalp 2 times weekly, let sit 5 minutes and rinse out ? ?Related Medications ?Tapinarof (VTAMA) 1 % CREA ?Apply 1 application topically in the morning and at bedtime. Apply to affected area ears as needed for psoriasis. ? ? ?Return in about 3 months (around 06/17/2022) for alopecia. ? ?I, SOthelia Pulling RMA, am acting as scribe for TBrendolyn Patty MD . ? ?Documentation: I have reviewed the above documentation for accuracy and completeness, and I agree with the above. ? ?TBrendolyn PattyMD  ? ?

## 2022-03-18 NOTE — Patient Instructions (Addendum)

## 2022-03-21 ENCOUNTER — Telehealth: Payer: Self-pay

## 2022-03-21 ENCOUNTER — Other Ambulatory Visit: Payer: Self-pay

## 2022-03-21 ENCOUNTER — Ambulatory Visit (INDEPENDENT_AMBULATORY_CARE_PROVIDER_SITE_OTHER): Payer: BC Managed Care – PPO | Admitting: Nurse Practitioner

## 2022-03-21 ENCOUNTER — Encounter: Payer: Self-pay | Admitting: Nurse Practitioner

## 2022-03-21 VITALS — BP 127/70 | HR 65 | Ht 63.0 in | Wt 180.2 lb

## 2022-03-21 DIAGNOSIS — Z1211 Encounter for screening for malignant neoplasm of colon: Secondary | ICD-10-CM

## 2022-03-21 DIAGNOSIS — Z0000 Encounter for general adult medical examination without abnormal findings: Secondary | ICD-10-CM | POA: Diagnosis not present

## 2022-03-21 MED ORDER — NA SULFATE-K SULFATE-MG SULF 17.5-3.13-1.6 GM/177ML PO SOLN: 1.0000 | Freq: Once | ORAL | 0 refills | Status: AC

## 2022-03-21 NOTE — Telephone Encounter (Signed)
Gastroenterology Pre-Procedure Review ? ?Request Date: 05/03/22 ?Requesting Physician: Dr. Vicente Males ? ?PATIENT REVIEW QUESTIONS: The patient responded to the following health history questions as indicated:   ? ?1. Are you having any GI issues? no Has had a gastric sleeve ?2. Do you have a personal history of Polyps? no ?3. Do you have a family history of Colon Cancer or Polyps? yes (mother and grandmother colon polyps) ?4. Diabetes Mellitus? no ?5. Joint replacements in the past 12 months?no ?6. Major health problems in the past 3 months?no ?7. Any artificial heart valves, MVP, or defibrillator?no ?   ?MEDICATIONS & ALLERGIES:    ?Patient reports the following regarding taking any anticoagulation/antiplatelet therapy:   ?Plavix, Coumadin, Eliquis, Xarelto, Lovenox, Pradaxa, Brilinta, or Effient? no ?Aspirin? no ? ?Patient confirms/reports the following medications:  ?Current Outpatient Medications  ?Medication Sig Dispense Refill  ? Calcipotriene-Betameth Diprop 0.005-0.064 % FOAM Apply 1 application topically daily as needed (for psoriasis).     ? finasteride (PROSCAR) 5 MG tablet TAKE 1 TABLET BY MOUTH EVERY DAY 90 tablet 1  ? ketoconazole (NIZORAL) 2 % shampoo Apply 1 application. topically 2 (two) times a week. Wash scalp 2 times weekly, let sit 5 minutes and rinse out 120 mL 6  ? minoxidil (LONITEN) 2.5 MG tablet Take 1 tablet (2.5 mg total) by mouth daily. 30 tablet 5  ? modafinil (PROVIGIL) 100 MG tablet Take 50 mg by mouth 2 (two) times daily.    ? omeprazole (PRILOSEC) 20 MG capsule TAKE 1 CAPSULE BY MOUTH EVERY DAY 90 capsule 2  ? Tapinarof (VTAMA) 1 % CREA Apply 1 application topically in the morning and at bedtime. Apply to affected area ears as needed for psoriasis. 60 g 2  ? ?No current facility-administered medications for this visit.  ? ? ?Patient confirms/reports the following allergies:  ?Allergies  ?Allergen Reactions  ? Elemental Sulfur Hives  ? Levaquin [Levofloxacin In D5w] Hives  ? Sulfa  Antibiotics Hives  ? ? ?No orders of the defined types were placed in this encounter. ? ? ?AUTHORIZATION INFORMATION ?Primary Insurance: ?1D#: ?Group #: ? ?Secondary Insurance: ?1D#: ?Group #: ? ?SCHEDULE INFORMATION: ?Date: 05/03/22 ?Time: ?Location: [facility] ?

## 2022-03-21 NOTE — Telephone Encounter (Signed)
-----   Message from Brendolyn Patty, MD sent at 03/20/2022  6:46 PM EDT ----- ?1. Skin , left post medial shoulder ?MELANOCYTIC NEVUS, INTRADERMAL TYPE, BASE INVOLVED ?2. Skin , left paraspinal upper back ?MELANOCYTIC NEVUS, INTRADERMAL TYPE, BASE INVOLVED ? ?1 and 2 are both benign moles - please call patient ?

## 2022-03-21 NOTE — Progress Notes (Signed)
? ?Established Patient Office Visit ? ?Subjective:  ?Patient ID: Krista Ferguson, female    DOB: 02-Dec-1975  Age: 46 y.o. MRN: [id number] ? ?CC:  ?Chief Complaint  ?Patient presents with  ? Annual Exam  ? ? ? ?HPI ? ?Krista Ferguson presents for annual physical. No concerns at present. Patient states she has partial hysterectomy. ? ?Flu:n/a ?Tetanus: 11/25/2012 ?COVID:Pfzier 03/03/20, 03/24/20  ?Colon Screening: no ?Vision screening: annually  ?Dentist:every 6 months ? ?Diet: meat, veges, less fruits, once a week outside., water, ?Exercise: no ?Mamogram : ready to schedule.  ? ?HPI  ? ?Past Medical History:  ?Diagnosis Date  ? Arthritis   ? Psosoratic  ? Atypical mole   ? lower abd, lower back ?  ? GERD (gastroesophageal reflux disease)   ? ? ?Past Surgical History:  ?Procedure Laterality Date  ? ABDOMINAL HYSTERECTOMY    ? Partial  ? CESAREAN SECTION  1995  ? CHOLECYSTECTOMY    ? LAPAROSCOPIC GASTRIC SLEEVE RESECTION N/A 03/02/2018  ? Procedure: LAPAROSCOPIC GASTRIC SLEEVE RESECTION WITH HIATAL HERNIA REPAIR AND UPPER ENDOSCOPY;  Surgeon: Johnathan Hausen, MD;  Location: [facility];  Service: General;  Laterality: N/A;  ? SINUSOTOMY  2011  ? ? ?Family History  ?Problem Relation Age of Onset  ? Hypertension Other   ? Diabetes Other   ? Stroke Other   ? Cancer Other   ? Asthma Other   ? COPD Other   ? Breast cancer Maternal Aunt   ? Breast cancer Maternal Grandmother   ? ? ?Social History  ? ?Socioeconomic History  ? Marital status: Divorced  ?  Spouse name: Not on file  ? Number of children: Not on file  ? Years of education: Not on file  ? Highest education level: Not on file  ?Occupational History  ? Not on file  ?Tobacco Use  ? Smoking status: Never  ? Smokeless tobacco: Never  ?Vaping Use  ? Vaping Use: Never used  ?Substance and Sexual Activity  ? Alcohol use: Yes  ?  Comment: Rare-2 times a year  ? Drug use: Never  ? Sexual activity: Yes  ?Other Topics Concern  ? Not on file  ?Social History Narrative  ?  Not on file  ? ?Social Determinants of Health  ? ?Financial Resource Strain: Not on file  ?Food Insecurity: Not on file  ?Transportation Needs: Not on file  ?Physical Activity: Not on file  ?Stress: Not on file  ?Social Connections: Not on file  ?Intimate Partner Violence: Not on file  ? ? ? ?Outpatient Medications Prior to Visit  ?Medication Sig Dispense Refill  ? Calcipotriene-Betameth Diprop 0.005-0.064 % FOAM Apply 1 application topically daily as needed (for psoriasis).     ? finasteride (PROSCAR) 5 MG tablet TAKE 1 TABLET BY MOUTH EVERY DAY 90 tablet 1  ? ketoconazole (NIZORAL) 2 % shampoo Apply 1 application. topically 2 (two) times a week. Wash scalp 2 times weekly, let sit 5 minutes and rinse out 120 mL 6  ? minoxidil (LONITEN) 2.5 MG tablet Take 1 tablet (2.5 mg total) by mouth daily. 30 tablet 5  ? modafinil (PROVIGIL) 100 MG tablet Take 50 mg by mouth 2 (two) times daily.    ? omeprazole (PRILOSEC) 20 MG capsule TAKE 1 CAPSULE BY MOUTH EVERY DAY 90 capsule 2  ? Tapinarof (VTAMA) 1 % CREA Apply 1 application topically in the morning and at bedtime. Apply to affected area ears as needed for psoriasis. 60 g 2  ? ?  No facility-administered medications prior to visit.  ? ? ?Allergies  ?Allergen Reactions  ? Elemental Sulfur Hives  ? Levaquin [Levofloxacin In D5w] Hives  ? Sulfa Antibiotics Hives  ? ? ?ROS ?Review of Systems  ?Constitutional:  Negative for activity change and appetite change.  ?HENT:  Negative for congestion and ear pain.   ?Eyes:  Negative for discharge and redness.  ?Respiratory:  Negative for chest tightness and shortness of breath.   ?Cardiovascular:  Negative for chest pain and palpitations.  ?Gastrointestinal:  Negative for abdominal pain and constipation.  ?Musculoskeletal:  Positive for joint swelling.  ?Neurological:  Negative for dizziness, facial asymmetry, light-headedness and headaches.  ?Psychiatric/Behavioral:  Negative for agitation, behavioral problems and confusion.   ? ?   ?Objective:  ?  ?Physical Exam ?Constitutional:   ?   Appearance: Normal appearance. She is obese.  ?HENT:  ?   Head: Normocephalic and atraumatic.  ?   Right Ear: Tympanic membrane normal.  ?   Left Ear: Tympanic membrane normal.  ?   Nose: Nose normal.  ?   Mouth/Throat:  ?   Mouth: Mucous membranes are moist.  ?   Pharynx: Oropharynx is clear.  ?Eyes:  ?   Extraocular Movements: Extraocular movements intact.  ?   Conjunctiva/sclera: Conjunctivae normal.  ?   Pupils: Pupils are equal, round, and reactive to light.  ?Cardiovascular:  ?   Rate and Rhythm: Normal rate and regular rhythm.  ?   Pulses: Normal pulses.  ?   Heart sounds: Normal heart sounds.  ?Pulmonary:  ?   Effort: Pulmonary effort is normal.  ?   Breath sounds: Normal breath sounds.  ?Abdominal:  ?   General: Bowel sounds are normal.  ?   Palpations: Abdomen is soft.  ?Musculoskeletal:     ?   General: No tenderness. Normal range of motion.  ?   Cervical back: Normal range of motion.  ?Skin: ?   General: Skin is warm.  ?   Capillary Refill: Capillary refill takes less than 2 seconds.  ?Neurological:  ?   General: No focal deficit present.  ?   Mental Status: She is alert and oriented to person, place, and time. Mental status is at baseline.  ?Psychiatric:     ?   Mood and Affect: Mood normal.     ?   Behavior: Behavior normal.     ?   Thought Content: Thought content normal.     ?   Judgment: Judgment normal.  ? ? ?BP 127/70   Pulse 65   Ht '5\' 3"'$  (1.6 m)   Wt 180 lb 3.2 oz (81.7 kg)   BMI 31.92 kg/m?  ?Wt Readings from Last 3 Encounters:  ?03/21/22 180 lb 3.2 oz (81.7 kg)  ?03/19/21 171 lb 3.2 oz (77.7 kg)  ?09/05/20 177 lb (80.3 kg)  ? ? ? ?Health Maintenance Due  ?Topic Date Due  ? HIV Screening  Never done  ? Hepatitis C Screening  Never done  ? COLONOSCOPY (Pts 45-64yr Insurance coverage will need to be confirmed)  Never done  ? ? ?There are no preventive care reminders to display for this patient. ? ?Lab Results  ?Component Value Date  ?  TSH 1.07 03/12/2021  ? ?Lab Results  ?Component Value Date  ? WBC 6.7 03/12/2021  ? HGB 13.4 03/12/2021  ? HCT 40.3 03/12/2021  ? MCV 92.9 03/12/2021  ? PLT 403 (H) 03/12/2021  ? ?Lab Results  ?Component Value Date  ?  NA 140 03/12/2021  ? K 4.2 03/12/2021  ? CO2 27 03/12/2021  ? GLUCOSE 95 03/12/2021  ? BUN 6 (L) 03/12/2021  ? CREATININE 0.68 03/12/2021  ? BILITOT 0.5 03/12/2021  ? ALKPHOS 58 03/02/2018  ? AST 19 03/12/2021  ? ALT 19 03/12/2021  ? PROT 7.4 03/12/2021  ? ALBUMIN 4.0 03/02/2018  ? CALCIUM 9.3 03/12/2021  ? ANIONGAP 11 03/02/2018  ? ?Lab Results  ?Component Value Date  ? CHOL 201 (H) 03/12/2021  ? ?Lab Results  ?Component Value Date  ? HDL 50 03/12/2021  ? ?Lab Results  ?Component Value Date  ? LDLCALC 129 (H) 03/12/2021  ? ?Lab Results  ?Component Value Date  ? TRIG 110 03/12/2021  ? ?Lab Results  ?Component Value Date  ? CHOLHDL 4.0 03/12/2021  ? ?No results found for: HGBA1C ? ?  ?Assessment & Plan:  ? ?Problem List Items Addressed This Visit   ? ?  ? Other  ? Annual physical exam - Primary  ? Relevant Orders  ? HIV Antibody (routine testing w rflx)  ? Hepatitis C antibody  ? Vitamin B12  ? Vitamin D 1,25 dihydroxy  ? Lipid panel  ? COMPLETE METABOLIC PANEL WITH GFR  ? CBC with Differential/Platelet  ? TSH  ? CA 125  ? Colon cancer screening  ? Relevant Orders  ? Ambulatory referral to Gastroenterology  ? ? ? ?No orders of the defined types were placed in this encounter. ? ? ? ?Follow-up: No follow-ups on file.  ? ? ?Theresia Lo, NP ?

## 2022-03-21 NOTE — Telephone Encounter (Signed)
Advised pt of bx results/sh ?

## 2022-03-24 DIAGNOSIS — Z1211 Encounter for screening for malignant neoplasm of colon: Secondary | ICD-10-CM | POA: Insufficient documentation

## 2022-03-24 NOTE — Assessment & Plan Note (Signed)
Encouraged patient to consume a balanced diet and regular exercise regimen. ?Advised to see an eye doctor and dentist annually.  ? ?Tetanus: UTD ?COVID: UTD ? ?Labs ordered ? ? ? ? ? ? ?

## 2022-03-25 LAB — CBC WITH DIFFERENTIAL/PLATELET
Absolute Monocytes: 491 cells/uL (ref 200–950)
Basophils Absolute: 62 cells/uL (ref 0–200)
Basophils Relative: 0.8 %
Eosinophils Absolute: 39 cells/uL (ref 15–500)
Eosinophils Relative: 0.5 %
HCT: 40 % (ref 35.0–45.0)
Hemoglobin: 13.6 g/dL (ref 11.7–15.5)
Lymphs Abs: 2098 cells/uL (ref 850–3900)
MCH: 32 pg (ref 27.0–33.0)
MCHC: 34 g/dL (ref 32.0–36.0)
MCV: 94.1 fL (ref 80.0–100.0)
MPV: 10 fL (ref 7.5–12.5)
Monocytes Relative: 6.3 %
Neutro Abs: 5109 cells/uL (ref 1500–7800)
Neutrophils Relative %: 65.5 %
Platelets: 324 10*3/uL (ref 140–400)
RBC: 4.25 10*6/uL (ref 3.80–5.10)
RDW: 11.8 % (ref 11.0–15.0)
Total Lymphocyte: 26.9 %
WBC: 7.8 10*3/uL (ref 3.8–10.8)

## 2022-03-25 LAB — COMPLETE METABOLIC PANEL WITH GFR
AG Ratio: 1.3 (calc) (ref 1.0–2.5)
ALT: 8 U/L (ref 6–29)
AST: 11 U/L (ref 10–35)
Albumin: 4.3 g/dL (ref 3.6–5.1)
Alkaline phosphatase (APISO): 53 U/L (ref 31–125)
BUN: 9 mg/dL (ref 7–25)
CO2: 24 mmol/L (ref 20–32)
Calcium: 9.3 mg/dL (ref 8.6–10.2)
Chloride: 104 mmol/L (ref 98–110)
Creat: 0.77 mg/dL (ref 0.50–0.99)
Globulin: 3.2 g/dL (calc) (ref 1.9–3.7)
Glucose, Bld: 80 mg/dL (ref 65–99)
Potassium: 4.4 mmol/L (ref 3.5–5.3)
Sodium: 139 mmol/L (ref 135–146)
Total Bilirubin: 0.7 mg/dL (ref 0.2–1.2)
Total Protein: 7.5 g/dL (ref 6.1–8.1)
eGFR: 97 mL/min/{1.73_m2} (ref >=60)

## 2022-03-25 LAB — LIPID PANEL
Cholesterol: 217 mg/dL — ABNORMAL HIGH (ref <200)
HDL: 68 mg/dL (ref >=50)
LDL Cholesterol (Calc): 130 mg/dL (calc) — ABNORMAL HIGH
Non-HDL Cholesterol (Calc): 149 mg/dL (calc) — ABNORMAL HIGH (ref <130)
Total CHOL/HDL Ratio: 3.2 (calc) (ref <5.0)
Triglycerides: 93 mg/dL (ref <150)

## 2022-03-25 LAB — HIV ANTIBODY (ROUTINE TESTING W REFLEX): HIV 1&2 Ab, 4th Generation: NONREACTIVE

## 2022-03-25 LAB — CA 125: CA 125: 5 U/mL (ref <35)

## 2022-03-25 LAB — VITAMIN D 1,25 DIHYDROXY
Vitamin D 1, 25 (OH)2 Total: 58 pg/mL (ref 18–72)
Vitamin D2 1, 25 (OH)2: <8 pg/mL
Vitamin D3 1, 25 (OH)2: 58 pg/mL

## 2022-03-25 LAB — HEPATITIS C ANTIBODY
Hepatitis C Ab: NONREACTIVE
SIGNAL TO CUT-OFF: 0.03 (ref <1.00)

## 2022-03-25 LAB — VITAMIN B12: Vitamin B-12: 366 pg/mL (ref 200–1100)

## 2022-03-25 LAB — TSH: TSH: 1.17 mIU/L

## 2022-05-03 ENCOUNTER — Encounter
Admission: RE | Disposition: A | Discharge status: Home / Self Care | Payer: Self-pay | Source: Home / Self Care | Attending: Gastroenterology

## 2022-05-03 ENCOUNTER — Ambulatory Visit
Admission: RE | Admit: 2022-05-03 | Discharge: 2022-05-03 | Disposition: A | Discharge status: Home / Self Care | Payer: BC Managed Care – PPO | Attending: Gastroenterology | Admitting: Gastroenterology

## 2022-05-03 ENCOUNTER — Ambulatory Visit: Payer: BC Managed Care – PPO | Admitting: Anesthesiology

## 2022-05-03 ENCOUNTER — Encounter: Payer: Self-pay | Admitting: Gastroenterology

## 2022-05-03 DIAGNOSIS — Z1211 Encounter for screening for malignant neoplasm of colon: Secondary | ICD-10-CM | POA: Insufficient documentation

## 2022-05-03 DIAGNOSIS — Z90711 Acquired absence of uterus with remaining cervical stump: Secondary | ICD-10-CM | POA: Diagnosis not present

## 2022-05-03 DIAGNOSIS — K219 Gastro-esophageal reflux disease without esophagitis: Secondary | ICD-10-CM | POA: Diagnosis not present

## 2022-05-03 DIAGNOSIS — Z9049 Acquired absence of other specified parts of digestive tract: Secondary | ICD-10-CM | POA: Insufficient documentation

## 2022-05-03 DIAGNOSIS — Z9884 Bariatric surgery status: Secondary | ICD-10-CM | POA: Diagnosis not present

## 2022-05-03 HISTORY — PX: COLONOSCOPY WITH PROPOFOL: SHX5780

## 2022-05-03 SURGERY — COLONOSCOPY WITH PROPOFOL
Anesthesia: General

## 2022-05-03 MED ORDER — PROPOFOL 500 MG/50ML IV EMUL
INTRAVENOUS | Status: AC
  Filled 2022-05-03: qty 50

## 2022-05-03 MED ORDER — PROPOFOL 500 MG/50ML IV EMUL
INTRAVENOUS | Status: DC | PRN
  Administered 2022-05-03: 180 ug/kg/min via INTRAVENOUS
  Administered 2022-05-03: 100 mg via INTRAVENOUS
  Administered 2022-05-03: 30 mg via INTRAVENOUS

## 2022-05-03 MED ORDER — LIDOCAINE HCL (CARDIAC) PF 100 MG/5ML IV SOSY
PREFILLED_SYRINGE | INTRAVENOUS | Status: DC | PRN
  Administered 2022-05-03: 50 mg via INTRAVENOUS

## 2022-05-03 MED ORDER — SODIUM CHLORIDE 0.9 % IV SOLN: INTRAVENOUS | Status: DC | PRN

## 2022-05-03 MED ORDER — SODIUM CHLORIDE 0.9 % IV SOLN: INTRAVENOUS | Status: DC

## 2022-05-03 NOTE — Anesthesia Postprocedure Evaluation (Signed)
Anesthesia Post Note  Patient: Krista Ferguson  Procedure(s) Performed: COLONOSCOPY WITH PROPOFOL  Patient location during evaluation: Endoscopy Anesthesia Type: General Level of consciousness: awake and alert Pain management: pain level controlled Vital Signs Assessment: post-procedure vital signs reviewed and stable Respiratory status: spontaneous breathing, nonlabored ventilation and respiratory function stable Cardiovascular status: blood pressure returned to baseline and stable Postop Assessment: no apparent nausea or vomiting Anesthetic complications: no   No notable events documented.   Last Vitals:  Vitals:   05/03/22 1103 05/03/22 1113  BP: 96/66 108/74  Pulse: 60 (!) 58  Resp: 15 15  Temp:    SpO2: 100% 100%    Last Pain:  Vitals:   05/03/22 1113  TempSrc:   PainSc: 0-No pain                 Iran Ouch

## 2022-05-03 NOTE — H&P (Signed)
Krista Darby, MD [address]  [place]  [place],  [address]  Main: [phone]  Fax: [phone] Pager: [phone]  Primary Care Physician:  Cletis Athens, MD Primary Gastroenterologist:  Dr. Cephas Ferguson  Pre-Procedure History & Physical: HPI:  Krista Ferguson is a 46 y.o. female is here for an colonoscopy.   Past Medical History:  Diagnosis Date   Arthritis    Psosoratic   Atypical mole    lower abd, lower back ?   GERD (gastroesophageal reflux disease)     Past Surgical History:  Procedure Laterality Date   ABDOMINAL HYSTERECTOMY     Partial   CESAREAN SECTION  1995   CHOLECYSTECTOMY     LAPAROSCOPIC GASTRIC SLEEVE RESECTION N/A 03/02/2018   Procedure: LAPAROSCOPIC GASTRIC SLEEVE RESECTION WITH HIATAL HERNIA REPAIR AND UPPER ENDOSCOPY;  Surgeon: Johnathan Hausen, MD;  Location: [facility];  Service: General;  Laterality: N/A;   SINUSOTOMY  2011    Prior to Admission medications   Medication Sig Start Date End Date Taking? Authorizing Provider  Calcipotriene-Betameth Diprop 0.005-0.064 % FOAM Apply 1 application topically daily as needed (for psoriasis).    Yes [provider]  finasteride (PROSCAR) 5 MG tablet TAKE 1 TABLET BY MOUTH EVERY DAY 12/06/21  Yes Masoud, Viann Shove, MD  modafinil (PROVIGIL) 100 MG tablet Take 50 mg by mouth 2 (two) times daily. 02/22/22  Yes [provider]  omeprazole (PRILOSEC) 20 MG capsule TAKE 1 CAPSULE BY MOUTH EVERY DAY 01/18/22  Yes Masoud, Viann Shove, MD  ketoconazole (NIZORAL) 2 % shampoo Apply 1 application. topically 2 (two) times a week. Wash scalp 2 times weekly, let sit 5 minutes and rinse out 03/18/22   Brendolyn Patty, MD  minoxidil (LONITEN) 2.5 MG tablet Take 1 tablet (2.5 mg total) by mouth daily. 03/18/22 04/17/22  Brendolyn Patty, MD  Tapinarof (VTAMA) 1 % CREA Apply 1 application topically in the morning and at bedtime. Apply to affected area ears as needed for psoriasis. 12/24/21   Brendolyn Patty, MD     Allergies as of 03/21/2022 - Review Complete 03/21/2022  Allergen Reaction Noted   Elemental sulfur Hives 03/17/2017   Levaquin [levofloxacin in d5w] Hives 03/17/2017   Sulfa antibiotics Hives 03/17/2017    Family History  Problem Relation Age of Onset   Hypertension Other    Diabetes Other    Stroke Other    Cancer Other    Asthma Other    COPD Other    Breast cancer Maternal Aunt    Breast cancer Maternal Grandmother     Social History   Socioeconomic History   Marital status: Divorced    Spouse name: Not on file   Number of children: Not on file   Years of education: Not on file   Highest education level: Not on file  Occupational History   Not on file  Tobacco Use   Smoking status: Never   Smokeless tobacco: Never  Vaping Use   Vaping Use: Never used  Substance and Sexual Activity   Alcohol use: Yes    Comment: Rare-2 times a year   Drug use: Never   Sexual activity: Yes  Other Topics Concern   Not on file  Social History Narrative   Not on file   Social Determinants of Health   Financial Resource Strain: Not on file  Food Insecurity: Not on file  Transportation Needs: Not on file  Physical Activity: Not on file  Stress: Not on file  Social  Connections: Not on file  Intimate Partner Violence: Not on file    Review of Systems: See HPI, otherwise negative ROS  Physical Exam: BP (!) 131/92   Pulse 69   Temp (!) 97.3 F (36.3 C) (Temporal)   Resp 16   Ht '5\' 3"'$  (1.6 m)   Wt 81.6 kg   SpO2 100%   BMI 31.89 kg/m  General:   Alert,  pleasant and cooperative in NAD Head:  Normocephalic and atraumatic. Neck:  Supple; no masses or thyromegaly. Lungs:  Clear throughout to auscultation.    Heart:  Regular rate and rhythm. Abdomen:  Soft, nontender and nondistended. Normal bowel sounds, without guarding, and without rebound.   Neurologic:  Alert and  oriented x4;  grossly normal neurologically.  Impression/Plan: Krista Ferguson is  here for an colonoscopy to be performed for colon cancer screening  Risks, benefits, limitations, and alternatives regarding  colonoscopy have been reviewed with the patient.  Questions have been answered.  All parties agreeable.   Sherri Sear, MD  05/03/2022, 10:15 AM

## 2022-05-03 NOTE — Transfer of Care (Signed)
Immediate Anesthesia Transfer of Care Note  Patient: Krista Ferguson  Procedure(s) Performed: COLONOSCOPY WITH PROPOFOL  Patient Location: PACU and Endoscopy Unit  Anesthesia Type:General  Level of Consciousness: drowsy  Airway & Oxygen Therapy: Patient Spontanous Breathing  Post-op Assessment: Report given to RN  Post vital signs: Reviewed and stable  Last Vitals:  Vitals Value Taken Time  BP 96/51 05/03/22 1053  Temp    Pulse 67 05/03/22 1053  Resp 16 05/03/22 1053  SpO2 100 % 05/03/22 1053    Last Pain:  Vitals:   05/03/22 1053  TempSrc:   PainSc: Asleep         Complications: No notable events documented.

## 2022-05-03 NOTE — Op Note (Signed)
[facility] Patient Name: Krista Ferguson Procedure Date: 05/03/2022 10:23 AM MRN: [id number] Account #: [id number] Date of Birth: 1976-08-17 Admit Type: Outpatient Age: 46 Room: [facility] ENDO ROOM 1 Gender: Female Note Status: Finalized Instrument Name: Peds Colonoscope [id number] Procedure:             Colonoscopy Indications:           Screening for colorectal malignant neoplasm, This is                         the patient's first colonoscopy Providers:             Lin Landsman MD, MD Referring MD:          Cletis Athens, MD (Referring MD) Medicines:             General Anesthesia Complications:         No immediate complications. Estimated blood loss: None. Procedure:             Pre-Anesthesia Assessment:                        - Prior to the procedure, a History and Physical was                         performed, and patient medications and allergies were                         reviewed. The patient is competent. The risks and                         benefits of the procedure and the sedation options and                         risks were discussed with the patient. All questions                         were answered and informed consent was obtained.                         Patient identification and proposed procedure were                         verified by the physician, the nurse, the                         anesthesiologist, the anesthetist and the technician                         in the pre-procedure area in the procedure room in the                         endoscopy suite. Mental Status Examination: alert and                         oriented. Airway Examination: normal oropharyngeal                         airway and neck mobility. Respiratory Examination:  clear to auscultation. CV Examination: normal.                         Prophylactic Antibiotics: The patient does not require                          prophylactic antibiotics. Prior Anticoagulants: The                         patient has taken no previous anticoagulant or                         antiplatelet agents. ASA Grade Assessment: II - A                         patient with mild systemic disease. After reviewing                         the risks and benefits, the patient was deemed in                         satisfactory condition to undergo the procedure. The                         anesthesia plan was to use general anesthesia.                         Immediately prior to administration of medications,                         the patient was re-assessed for adequacy to receive                         sedatives. The heart rate, respiratory rate, oxygen                         saturations, blood pressure, adequacy of pulmonary                         ventilation, and response to care were monitored                         throughout the procedure. The physical status of the                         patient was re-assessed after the procedure.                        After obtaining informed consent, the colonoscope was                         passed under direct vision. Throughout the procedure,                         the patient's blood pressure, pulse, and oxygen                         saturations were monitored continuously. The  Colonoscope was introduced through the anus and                         advanced to the the cecum, identified by appendiceal                         orifice and ileocecal valve. The colonoscopy was                         performed with moderate difficulty due to significant                         looping. Successful completion of the procedure was                         aided by applying abdominal pressure. The patient                         tolerated the procedure well. The quality of the bowel                         preparation was evaluated using the BBPS [facility] Bowel                          Preparation Scale) with scores of: Right Colon = 3,                         Transverse Colon = 3 and Left Colon = 3 (entire mucosa                         seen well with no residual staining, small fragments                         of stool or opaque liquid). The total BBPS score                         equals 9. Findings:      The perianal and digital rectal examinations were normal. Pertinent       negatives include normal sphincter tone and no palpable rectal lesions.      The entire examined colon appeared normal.      The retroflexed view of the distal rectum and anal verge was normal and       showed no anal or rectal abnormalities. Impression:            - The entire examined colon is normal.                        - The distal rectum and anal verge are normal on                         retroflexion view.                        - No specimens collected. Recommendation:        - Discharge patient to home (with escort).                        -  Resume previous diet today.                        - Continue present medications.                        - Repeat colonoscopy in 10 years for screening                         purposes. Procedure Code(s):     --- Professional ---                        L3734, Colorectal cancer screening; colonoscopy on                         individual not meeting criteria for high risk Diagnosis Code(s):     --- Professional ---                        Z12.11, Encounter for screening for malignant neoplasm                         of colon CPT copyright 2019 [employer]. All rights reserved. The codes documented in this report are preliminary and upon coder review may  be revised to meet current compliance requirements. Dr. Ulyess Mort Lin Landsman MD, MD 05/03/2022 10:52:17 AM This report has been signed electronically. Number of Addenda: 0 Note Initiated On: 05/03/2022 10:23 AM Scope Withdrawal Time: 0 hours  12 minutes 8 seconds  Total Procedure Duration: 0 hours 17 minutes 43 seconds  Estimated Blood Loss:  Estimated blood loss: none.      [facility]

## 2022-05-03 NOTE — Anesthesia Preprocedure Evaluation (Signed)
Anesthesia Evaluation  Patient identified by MRN, date of birth, ID band Patient awake    Reviewed: Allergy & Precautions, NPO status , Patient's Chart, lab work & pertinent test results  Airway Mallampati: II  TM Distance: >3 FB Neck ROM: full    Dental  (+) Chipped   Pulmonary neg pulmonary ROS,    Pulmonary exam normal        Cardiovascular negative cardio ROS Normal cardiovascular exam     Neuro/Psych negative neurological ROS  negative psych ROS   GI/Hepatic Neg liver ROS, GERD  Controlled and Medicated,S/P laparoscopic sleeve gastrectomy   Endo/Other  negative endocrine ROS  Renal/GU negative Renal ROS  negative genitourinary   Musculoskeletal   Abdominal Normal abdominal exam  (+)   Peds  Hematology negative hematology ROS (+)   Anesthesia Other Findings Past Medical History: No date: Arthritis     Comment:  Psosoratic No date: Atypical mole     Comment:  lower abd, lower back ? No date: GERD (gastroesophageal reflux disease)  Past Surgical History: No date: ABDOMINAL HYSTERECTOMY     Comment:  Partial 1995: CESAREAN SECTION No date: CHOLECYSTECTOMY 03/02/2018: LAPAROSCOPIC GASTRIC SLEEVE RESECTION; N/A     Comment:  Procedure: LAPAROSCOPIC GASTRIC SLEEVE RESECTION WITH               HIATAL HERNIA REPAIR AND UPPER ENDOSCOPY;  Surgeon:               Johnathan Hausen, MD;  Location: [facility];  Service:               General;  Laterality: N/A; 2011: SINUSOTOMY  BMI    Body Mass Index: 31.89 kg/m      Reproductive/Obstetrics negative OB ROS                             Anesthesia Physical Anesthesia Plan  ASA: 2  Anesthesia Plan: General   Post-op Pain Management:    Induction: Intravenous  PONV Risk Score and Plan: Propofol infusion and TIVA  Airway Management Planned: Natural Airway and Nasal Cannula  Additional Equipment:   Intra-op Plan:   Post-operative  Plan:   Informed Consent: I have reviewed the patients History and Physical, chart, labs and discussed the procedure including the risks, benefits and alternatives for the proposed anesthesia with the patient or authorized representative who has indicated his/her understanding and acceptance.     Dental Advisory Given  Plan Discussed with: Anesthesiologist, CRNA and Surgeon  Anesthesia Plan Comments:         Anesthesia Quick Evaluation

## 2022-05-06 ENCOUNTER — Encounter: Payer: Self-pay | Admitting: Gastroenterology

## 2022-05-31 ENCOUNTER — Other Ambulatory Visit: Payer: Self-pay | Admitting: Internal Medicine

## 2022-05-31 DIAGNOSIS — L659 Nonscarring hair loss, unspecified: Secondary | ICD-10-CM

## 2022-06-18 ENCOUNTER — Ambulatory Visit: Payer: BC Managed Care – PPO | Admitting: Dermatology

## 2022-06-29 ENCOUNTER — Other Ambulatory Visit: Payer: Self-pay | Admitting: Dermatology

## 2022-06-29 DIAGNOSIS — L649 Androgenic alopecia, unspecified: Secondary | ICD-10-CM

## 2022-07-09 ENCOUNTER — Ambulatory Visit: Payer: BC Managed Care – PPO | Admitting: Dermatology

## 2022-07-09 DIAGNOSIS — L409 Psoriasis, unspecified: Secondary | ICD-10-CM | POA: Diagnosis not present

## 2022-07-09 DIAGNOSIS — L649 Androgenic alopecia, unspecified: Secondary | ICD-10-CM

## 2022-07-09 MED ORDER — MINOXIDIL 2.5 MG PO TABS: 2.5000 mg | ORAL_TABLET | Freq: Every day | ORAL | 1 refills | Status: DC

## 2022-07-09 NOTE — Patient Instructions (Addendum)
Doses of minoxidil for hair loss are considered 'low dose'. This is because the doses used for hair loss are a lot lower than the doses which are used for conditions such as high blood pressure (hypertension). The doses used for hypertension are 10-40mg per day.  Side effects are uncommon at the low doses (up to 2.5 mg/day) used to treat hair loss. Potential side effects, more commonly seen at higher doses, include: Increase in hair growth (hypertrichosis) elsewhere on face and body Temporary hair shedding upon starting medication which may last up to 4 weeks Ankle swelling, fluid retention, rapid weight gain more than 5 pounds Low blood pressure and feeling lightheaded or dizzy when standing up quickly Fast or irregular heartbeat Headaches      Due to recent changes in healthcare laws, you may see results of your pathology and/or laboratory studies on MyChart before the doctors have had a chance to review them. We understand that in some cases there may be results that are confusing or concerning to you. Please understand that not all results are received at the same time and often the doctors may need to interpret multiple results in order to provide you with the best plan of care or course of treatment. Therefore, we ask that you please give us 2 business days to thoroughly review all your results before contacting the office for clarification. Should we see a critical lab result, you will be contacted sooner.   If You Need Anything After Your Visit  If you have any questions or concerns for your doctor, please call our main line at [phone] and press option 4 to reach your doctor's medical assistant. If no one answers, please leave a voicemail as directed and we will return your call as soon as possible. Messages left after 4 pm will be answered the following business day.   You may also send us a message via MyChart. We typically respond to MyChart messages within 1-2 business  days.  For prescription refills, please ask your pharmacy to contact our office. Our fax number is [phone].  If you have an urgent issue when the clinic is closed that cannot wait until the next business day, you can page your doctor at the number below.    Please note that while we do our best to be available for urgent issues outside of office hours, we are not available 24/7.   If you have an urgent issue and are unable to reach us, you may choose to seek medical care at your doctor's office, retail clinic, urgent care center, or emergency room.  If you have a medical emergency, please immediately call 911 or go to the emergency department.  Pager Numbers  - Dr. Kowalski: [phone]  - Dr. Moye: [phone]  - Dr. Stewart: [phone]  In the event of inclement weather, please call our main line at [phone] for an update on the status of any delays or closures.  Dermatology Medication Tips: Please keep the boxes that topical medications come in in order to help keep track of the instructions about where and how to use these. Pharmacies typically print the medication instructions only on the boxes and not directly on the medication tubes.   If your medication is too expensive, please contact our office at [phone] option 4 or send us a message through MyChart.   We are unable to tell what your co-pay for medications will be in advance as this is different depending on your insurance coverage. However, we   may be able to find a substitute medication at lower cost or fill out paperwork to get insurance to cover a needed medication.   If a prior authorization is required to get your medication covered by your insurance company, please allow us 1-2 business days to complete this process.  Drug prices often vary depending on where the prescription is filled and some pharmacies may offer cheaper prices.  The website www.goodrx.com contains coupons for medications through  different pharmacies. The prices here do not account for what the cost may be with help from insurance (it may be cheaper with your insurance), but the website can give you the price if you did not use any insurance.  - You can print the associated coupon and take it with your prescription to the pharmacy.  - You may also stop by our office during regular business hours and pick up a GoodRx coupon card.  - If you need your prescription sent electronically to a different pharmacy, notify our office through [place] MyChart or by phone at [phone] option 4.     Si Usted Necesita Algo Despus de Su Visita  Tambin puede enviarnos un mensaje a travs de MyChart. Por lo general respondemos a los mensajes de MyChart en el transcurso de 1 a 2 das hbiles.  Para renovar recetas, por favor pida a su farmacia que se ponga en contacto con nuestra oficina. Nuestro nmero de fax es el [phone].  Si tiene un asunto urgente cuando la clnica est cerrada y que no puede esperar hasta el siguiente da hbil, puede llamar/localizar a su doctor(a) al nmero que aparece a continuacin.   Por favor, tenga en cuenta que aunque hacemos todo lo posible para estar disponibles para asuntos urgentes fuera del horario de oficina, no estamos disponibles las 24 horas del da, los 7 das de la semana.   Si tiene un problema urgente y no puede comunicarse con nosotros, puede optar por buscar atencin mdica  en el consultorio de su doctor(a), en una clnica privada, en un centro de atencin urgente o en una sala de emergencias.  Si tiene una emergencia mdica, por favor llame inmediatamente al 911 o vaya a la sala de emergencias.  Nmeros de bper  - Dr. Kowalski: [phone]  - Dra. Moye: [phone]  - Dra. Stewart: [phone]  En caso de inclemencias del tiempo, por favor llame a nuestra lnea principal al [phone] para una actualizacin sobre el estado de cualquier retraso o cierre.  Consejos  para la medicacin en dermatologa: Por favor, guarde las cajas en las que vienen los medicamentos de uso tpico para ayudarle a seguir las instrucciones sobre dnde y cmo usarlos. Las farmacias generalmente imprimen las instrucciones del medicamento slo en las cajas y no directamente en los tubos del medicamento.   Si su medicamento es muy caro, por favor, pngase en contacto con nuestra oficina llamando al [phone] y presione la opcin 4 o envenos un mensaje a travs de MyChart.   No podemos decirle cul ser su copago por los medicamentos por adelantado ya que esto es diferente dependiendo de la cobertura de su seguro. Sin embargo, es posible que podamos encontrar un medicamento sustituto a menor costo o llenar un formulario para que el seguro cubra el medicamento que se considera necesario.   Si se requiere una autorizacin previa para que su compaa de seguros cubra su medicamento, por favor permtanos de 1 a 2 das hbiles para completar este proceso.  Los precios de los medicamentos   frecuencia dependiendo del lugar de dnde se surte la receta y alguna farmacias pueden ofrecer precios ms baratos.  El sitio web www.goodrx.com tiene cupones para medicamentos de Airline pilot. Los precios aqu no tienen en cuenta lo que podra costar con la ayuda del seguro (puede ser ms barato con su seguro), pero el sitio web puede darle el precio si no utiliz Research scientist (physical sciences).  - Puede imprimir el cupn correspondiente y llevarlo con su receta a la farmacia.  - Tambin puede pasar por nuestra oficina durante el horario de atencin regular y Charity fundraiser una tarjeta de cupones de GoodRx.  - Si necesita que su receta se enve electrnicamente a una farmacia diferente, informe a nuestra oficina a travs de MyChart de [place] o por telfono llamando al [phone] y presione la opcin 4.

## 2022-07-09 NOTE — Progress Notes (Signed)
Follow-Up Visit   Subjective  Krista Ferguson is a 46 y.o. female who presents for the following: Androgenetic alopecia (Improving. Patient is on minoxidil 2.'5mg'$  1 po QD, finasteride '5mg'$  QD, ketoconazole shampoo, and red light therapy 2x/wk. ). She also has psoriasis around ears.  Vtama really helps.   The following portions of the chart were reviewed this encounter and updated as appropriate:       Review of Systems:  No other skin or systemic complaints except as noted in HPI or Assessment and Plan.  Objective  Well appearing patient in no apparent distress; mood and affect are within normal limits.  A focused examination was performed including face, scalp. Relevant physical exam findings are noted in the Assessment and Plan.  Scalp Improvement with slight narrowing of the midline part, previous photos compared. Prominent hair thinning on crown  Right Postauricular Mild erythema and scale of the bil postaurciular crease.    Assessment & Plan  Androgenetic alopecia Scalp  Chronic and persistent condition with duration or expected duration over one year. Condition is symptomatic/ bothersome to patient. Improving but Not currently at goal.   Female Androgenic Alopecia is a chronic condition related to genetics and/or hormonal changes.  In women androgenetic alopecia is commonly associated with menopause but may occur any time after puberty.  It causes hair thinning primarily on the crown with widening of the part and temporal hairline recession.  Can use OTC Rogaine (minoxidil) 5% solution/foam as directed.  Oral treatments in female patients who have no contraindication may include : - Low dose oral minoxidil 1.25 - '5mg'$  daily - Spironolactone 50 - '100mg'$  bid - Finasteride 2.5 - 5 mg daily Adjunctive therapies include: - Low Level Laser Light Therapy (LLLT) - Platelet-rich plasma injections (PRP) - Hair Transplants or scalp reduction  BP 110/68  Continue  Minoxidil 2.'5mg'$ , increase from 1/2 tab to 1 po qd Cont Finasteride '5mg'$  1 po qd (PCP is prescribing) Cont Red light therapy 2x/week.  Pt uses IRestore helmet Check ferritin, 4/23-nl TSH  Doses of minoxidil for hair loss are considered 'low dose'. This is because the doses used for hair loss are a lot lower than the doses which are used for conditions such as high blood pressure (hypertension). The doses used for hypertension are 10-'40mg'$  per day.  Side effects are uncommon at the low doses (up to 2.5 mg/day) used to treat hair loss. Potential side effects, more commonly seen at higher doses, include: Increase in hair growth (hypertrichosis) elsewhere on face and body Temporary hair shedding upon starting medication which may last up to 4 weeks Ankle swelling, fluid retention, rapid weight gain more than 5 pounds Low blood pressure and feeling lightheaded or dizzy when standing up quickly Fast or irregular heartbeat Headaches   minoxidil (LONITEN) 2.5 MG tablet - Scalp Take 1 tablet (2.5 mg total) by mouth daily.  Ferritin - Scalp  Psoriasis Right Postauricular  Chronic condition with duration or expected duration over one year. Currently well-controlled.   Psoriasis is a chronic non-curable, but treatable genetic/hereditary disease that may have other systemic features affecting other organ systems such as joints (Psoriatic Arthritis). It is associated with an increased risk of inflammatory bowel disease, heart disease, non-alcoholic fatty liver disease, and depression.    Continue Vtama cream Apply qd/bid Continue ketoconazole 2% shampoo 2x/wk as directed   Related Medications Tapinarof (VTAMA) 1 % CREA Apply 1 application topically in the morning and at bedtime. Apply to affected area ears as  needed for psoriasis.  ketoconazole (NIZORAL) 2 % shampoo Apply 1 application. topically 2 (two) times a week. Wash scalp 2 times weekly, let sit 5 minutes and rinse out   Return in about 6  months (around 01/09/2023) for Alopecia.  IJamesetta Orleans, CMA, am acting as scribe for Brendolyn Patty, MD .  Documentation: I have reviewed the above documentation for accuracy and completeness, and I agree with the above.  Brendolyn Patty MD

## 2022-07-10 ENCOUNTER — Telehealth: Payer: Self-pay

## 2022-07-10 LAB — FERRITIN: Ferritin: 52 ng/mL (ref 15–150)

## 2022-07-10 NOTE — Telephone Encounter (Signed)
Advised patient labs were normal and not a cause of hair loss.

## 2022-07-10 NOTE — Telephone Encounter (Signed)
-----   Message from Brendolyn Patty, MD sent at 07/10/2022  8:39 AM EDT ----- Ferritin (iron stores) is normal, not a cause of hair loss   - please call patient

## 2022-08-27 ENCOUNTER — Other Ambulatory Visit: Payer: Self-pay | Admitting: Internal Medicine

## 2022-11-27 ENCOUNTER — Other Ambulatory Visit: Payer: Self-pay | Admitting: Internal Medicine

## 2022-11-27 DIAGNOSIS — L659 Nonscarring hair loss, unspecified: Secondary | ICD-10-CM

## 2022-12-12 NOTE — Progress Notes (Signed)
Established Patient Office Visit  Subjective:  Patient ID: Krista Ferguson, female    DOB: [birth year]  Age: 47 y.o. MRN: [id number]  CC:  Chief Complaint  Patient presents with   Transitions Of Care     HPI  Krista Ferguson presents for transition of care. She has h/o of hypersomia, GERD, carpal tunnel, psoriatic arthritis and androgenic alopecia. She is followed by dermatologist Dr. Baxter Flattery and neurologist Dr. Manuella Ghazi.  She has recently noticed her BP being elevated on home readings of 140-160/80-100. She also complaint of lower extremity edema.   HPI   Past Medical History:  Diagnosis Date   Arthritis    Psosoratic   Atypical mole    lower abd, lower back ?   GERD (gastroesophageal reflux disease)     Past Surgical History:  Procedure Laterality Date   ABDOMINAL HYSTERECTOMY     Partial   CESAREAN SECTION  1995   CHOLECYSTECTOMY     COLONOSCOPY WITH PROPOFOL N/A 05/03/2022   Procedure: COLONOSCOPY WITH PROPOFOL;  Surgeon: Lin Landsman, MD;  Location: [facility] END[facility]COPY;  Service: Gastroenterology;  Laterality: N/A;   LAPAROSCOPIC GASTRIC SLEEVE RESECTION N/A 03/02/2018   Procedure: LAPAROSCOPIC GASTRIC SLEEVE RESECTION WITH HIATAL HERNIA REPAIR AND UPPER ENDOSCOPY;  Surgeon: Johnathan Hausen, MD;  Location: [facility];  Service: General;  Laterality: N/A;   SINUSOTOMY  2011    Family History  Problem Relation Age of Onset   Hypertension Other    Diabetes Other    Stroke Other    Cancer Other    Asthma Other    COPD Other    Breast cancer Maternal Aunt    Breast cancer Maternal Grandmother     Social History   Socioeconomic History   Marital status: Divorced    Spouse name: Not on file   Number of children: Not on file   Years of education: Not on file   Highest education level: Not on file  Occupational History   Not on file  Tobacco Use   Smoking status: Never   Smokeless tobacco: Never  Vaping Use   Vaping Use: Never used  Substance  and Sexual Activity   Alcohol use: Yes    Comment: Rare-2 times a year   Drug use: Never   Sexual activity: Yes  Other Topics Concern   Not on file  Social History Narrative   Not on file   Social Determinants of Health   Financial Resource Strain: Not on file  Food Insecurity: Not on file  Transportation Needs: Not on file  Physical Activity: Not on file  Stress: Not on file  Social Connections: Not on file  Intimate Partner Violence: Not on file     Outpatient Medications Prior to Visit  Medication Sig Dispense Refill   Calcipotriene-Betameth Diprop 0.005-0.064 % FOAM Apply 1 application topically daily as needed (for psoriasis).      finasteride (PROSCAR) 5 MG tablet TAKE 1 TABLET BY MOUTH EVERY DAY 90 tablet 0   ketoconazole (NIZORAL) 2 % shampoo Apply 1 application. topically 2 (two) times a week. Wash scalp 2 times weekly, let sit 5 minutes and rinse out 120 mL 6   minoxidil (LONITEN) 2.5 MG tablet Take 1 tablet (2.5 mg total) by mouth daily. 90 tablet 1   modafinil (PROVIGIL) 100 MG tablet Take 50 mg by mouth 2 (two) times daily.     omeprazole (PRILOSEC) 20 MG capsule TAKE 1 CAPSULE BY MOUTH EVERY DAY 90 capsule 2  Tapinarof (VTAMA) 1 % CREA Apply 1 application topically in the morning and at bedtime. Apply to affected area ears as needed for psoriasis. 60 g 2   No facility-administered medications prior to visit.    Allergies  Allergen Reactions   Elemental Sulfur Hives   Levaquin [Levofloxacin In D5w] Hives   Sulfa Antibiotics Hives    ROS Review of Systems  Constitutional: Negative.   HENT: Negative.    Eyes: Negative.   Respiratory:  Negative for chest tightness and shortness of breath.   Cardiovascular:  Positive for leg swelling.  Gastrointestinal: Negative.   Endocrine: Negative.   Genitourinary: Negative.   Musculoskeletal:  Positive for arthralgias and joint swelling.  Skin: Negative.   Neurological:  Negative for dizziness, facial asymmetry and  headaches.  Hematological: Negative.   Psychiatric/Behavioral: Negative.        Objective:    Physical Exam Constitutional:      Appearance: Normal appearance. She is obese.  HENT:     Head: Normocephalic and atraumatic.     Right Ear: Tympanic membrane normal.     Left Ear: Tympanic membrane normal.     Nose: Nose normal.     Mouth/Throat:     Mouth: Mucous membranes are moist.  Cardiovascular:     Rate and Rhythm: Normal rate and regular rhythm.     Pulses: Normal pulses.     Heart sounds: Normal heart sounds.  Pulmonary:     Effort: Pulmonary effort is normal.     Breath sounds: Normal breath sounds.  Abdominal:     General: Bowel sounds are normal.     Palpations: Abdomen is soft.  Musculoskeletal:        General: No swelling.     Comments: Slight ankle edema bilaterally.   Skin:    Coloration: Skin is not jaundiced.     Findings: No erythema.  Neurological:     General: No focal deficit present.     Mental Status: She is alert and oriented to person, place, and time. Mental status is at baseline.  Psychiatric:        Mood and Affect: Mood normal.        Behavior: Behavior normal.        Thought Content: Thought content normal.        Judgment: Judgment normal.     BP (!) 140/80   Pulse 72   Temp 98 F (36.7 C) (Temporal)   Resp 14   Ht '5\' 3"'$  (1.6 m)   Wt 185 lb (83.9 kg)   SpO2 98%   BMI 32.77 kg/m  Wt Readings from Last 3 Encounters:  12/13/22 185 lb (83.9 kg)  05/03/22 180 lb (81.6 kg)  03/21/22 180 lb 3.2 oz (81.7 kg)     Health Maintenance  Topic Date Due   COVID-19 Vaccine (3 - Pfizer risk series) 12/29/2022 (Originally 04/21/2020)   INFLUENZA VACCINE  02/23/2023 (Originally 06/25/2022)   COLONOSCOPY (Pts 45-24yr Insurance coverage will need to be confirmed)  05/03/2032   Hepatitis C Screening  Completed   HIV Screening  Completed   HPV VACCINES  Aged Out   DTaP/Tdap/Td  Discontinued   PAP SMEAR-Modifier  Discontinued    There are no  preventive care reminders to display for this patient.  Lab Results  Component Value Date   TSH 1.17 03/21/2022   Lab Results  Component Value Date   WBC 7.8 03/21/2022   HGB 13.6 03/21/2022   HCT 40.0 03/21/2022  MCV 94.1 03/21/2022   PLT 324 03/21/2022   Lab Results  Component Value Date   NA 139 03/21/2022   K 4.4 03/21/2022   CO2 24 03/21/2022   GLUCOSE 80 03/21/2022   BUN 9 03/21/2022   CREATININE 0.77 03/21/2022   BILITOT 0.7 03/21/2022   ALKPHOS 58 03/02/2018   AST 11 03/21/2022   ALT 8 03/21/2022   PROT 7.5 03/21/2022   ALBUMIN 4.0 03/02/2018   CALCIUM 9.3 03/21/2022   ANIONGAP 11 03/02/2018   EGFR 97 03/21/2022   Lab Results  Component Value Date   CHOL 217 (H) 03/21/2022   Lab Results  Component Value Date   HDL 68 03/21/2022   Lab Results  Component Value Date   LDLCALC 130 (H) 03/21/2022   Lab Results  Component Value Date   TRIG 93 03/21/2022   Lab Results  Component Value Date   CHOLHDL 3.2 03/21/2022   No results found for: "HGBA1C"    Assessment & Plan:   Problem List Items Addressed This Visit       Cardiovascular and Mediastinum   Essential hypertension - Primary    Patient BP  Vitals:   12/13/22 0836 12/13/22 1513  BP: (!) 140/80 (!) 140/80    in the office 12/16/22  Advised pt to follow a low sodium and heart healthy diet. Started her on HCTZ 12.5 mg once daily. Encouraged patient to check blood pressure at home and send the numbers via MyChart. Will check basic metabolic panel in 2 weeks.      Relevant Medications   hydrochlorothiazide (MICROZIDE) 12.5 MG capsule   Other Relevant Orders   Basic Metabolic Panel (BMET)   CBC w/Diff   Comp Met (CMET)     Other   Metabolic syndrome    Body mass index is 32.77 kg/m. Advised pt to lose weight. Advised patient to avoid trans fat, fatty and fried food. Follow a regular physical activity schedule.          Relevant Orders   Lipid Profile   TSH   Lower  extremity edema    Slight ankle edema. Started her on hydrochlorothiazide. Encourage patient to keep feet elevated when possible and can use compression stockings.        Meds ordered this encounter  Medications   hydrochlorothiazide (MICROZIDE) 12.5 MG capsule    Sig: Take 1 capsule (12.5 mg total) by mouth daily.    Dispense:  30 capsule    Refill:  1     Follow-up: Return in about 4 months (around 04/13/2023) for annual physical and labs 1 week before the physical.    Theresia Lo, NP

## 2022-12-13 ENCOUNTER — Ambulatory Visit: Payer: BC Managed Care – PPO | Admitting: Nurse Practitioner

## 2022-12-13 ENCOUNTER — Encounter: Payer: Self-pay | Admitting: Nurse Practitioner

## 2022-12-13 ENCOUNTER — Telehealth: Payer: Self-pay

## 2022-12-13 VITALS — BP 140/80 | HR 72 | Temp 98.0°F | Resp 14 | Ht 63.0 in | Wt 185.0 lb

## 2022-12-13 DIAGNOSIS — I10 Essential (primary) hypertension: Secondary | ICD-10-CM

## 2022-12-13 DIAGNOSIS — R600 Localized edema: Secondary | ICD-10-CM

## 2022-12-13 DIAGNOSIS — Z0000 Encounter for general adult medical examination without abnormal findings: Secondary | ICD-10-CM

## 2022-12-13 DIAGNOSIS — E8881 Metabolic syndrome: Secondary | ICD-10-CM

## 2022-12-13 MED ORDER — HYDROCHLOROTHIAZIDE 12.5 MG PO CAPS: 12.5000 mg | ORAL_CAPSULE | Freq: Every day | ORAL | 1 refills | Status: DC

## 2022-12-13 NOTE — Patient Instructions (Addendum)
Started her on hydrochlorothiazide 12.5 mg daily for blood pressure and swelling of the legs. Will check BMP in 2 weeks. Advised patient to check blood pressure at home and send the numbers via MyChart. Encouraged her to manage diet  and use healthy option for snacks.

## 2022-12-13 NOTE — Telephone Encounter (Signed)
Lab orders placed.  

## 2022-12-13 NOTE — Telephone Encounter (Signed)
Patient states she usually has labs before her physical and asked that we schedule that appointment as well.  I scheduled it for her in addition to the non-fasting lab she states she should have in two weeks (we have the lab order for this one).

## 2022-12-16 ENCOUNTER — Encounter: Payer: Self-pay | Admitting: Nurse Practitioner

## 2022-12-16 NOTE — Assessment & Plan Note (Signed)
Slight ankle edema. Started her on hydrochlorothiazide. Encourage patient to keep feet elevated when possible and can use compression stockings.

## 2022-12-16 NOTE — Assessment & Plan Note (Signed)
Body mass index is 32.77 kg/m. Advised pt to lose weight. Advised patient to avoid trans fat, fatty and fried food. Follow a regular physical activity schedule.

## 2022-12-16 NOTE — Assessment & Plan Note (Addendum)
Patient BP  Vitals:   12/13/22 0836 12/13/22 1513  BP: (!) 140/80 (!) 140/80    in the office 12/16/22  Advised pt to follow a low sodium and heart healthy diet. Started her on HCTZ 12.5 mg once daily. Encouraged patient to check blood pressure at home and send the numbers via MyChart. Will check basic metabolic panel in 2 weeks.

## 2022-12-23 ENCOUNTER — Encounter: Payer: Self-pay | Admitting: Nurse Practitioner

## 2022-12-27 ENCOUNTER — Other Ambulatory Visit (INDEPENDENT_AMBULATORY_CARE_PROVIDER_SITE_OTHER): Payer: BC Managed Care – PPO

## 2022-12-27 DIAGNOSIS — I10 Essential (primary) hypertension: Secondary | ICD-10-CM

## 2022-12-27 DIAGNOSIS — E8881 Metabolic syndrome: Secondary | ICD-10-CM | POA: Diagnosis not present

## 2022-12-27 LAB — TSH: TSH: 1.78 u[IU]/mL (ref 0.35–5.50)

## 2022-12-27 LAB — CBC WITH DIFFERENTIAL/PLATELET
Basophils Absolute: 0 10*3/uL (ref 0.0–0.1)
Basophils Relative: 0.7 % (ref 0.0–3.0)
Eosinophils Absolute: 0.1 10*3/uL (ref 0.0–0.7)
Eosinophils Relative: 1.3 % (ref 0.0–5.0)
HCT: 39.8 % (ref 36.0–46.0)
Hemoglobin: 13.7 g/dL (ref 12.0–15.0)
Lymphocytes Relative: 33.6 % (ref 12.0–46.0)
Lymphs Abs: 2 10*3/uL (ref 0.7–4.0)
MCHC: 34.5 g/dL (ref 30.0–36.0)
MCV: 92.2 fl (ref 78.0–100.0)
Monocytes Absolute: 0.4 10*3/uL (ref 0.1–1.0)
Monocytes Relative: 7.2 % (ref 3.0–12.0)
Neutro Abs: 3.5 10*3/uL (ref 1.4–7.7)
Neutrophils Relative %: 57.2 % (ref 43.0–77.0)
Platelets: 359 10*3/uL (ref 150.0–400.0)
RBC: 4.31 Mil/uL (ref 3.87–5.11)
RDW: 12.9 % (ref 11.5–15.5)
WBC: 6.1 10*3/uL (ref 4.0–10.5)

## 2022-12-27 LAB — COMPREHENSIVE METABOLIC PANEL
ALT: 12 U/L (ref 0–35)
AST: 13 U/L (ref 0–37)
Albumin: 4.1 g/dL (ref 3.5–5.2)
Alkaline Phosphatase: 53 U/L (ref 39–117)
BUN: 8 mg/dL (ref 6–23)
CO2: 30 mEq/L (ref 19–32)
Calcium: 8.9 mg/dL (ref 8.4–10.5)
Chloride: 102 mEq/L (ref 96–112)
Creatinine, Ser: 0.81 mg/dL (ref 0.40–1.20)
GFR: 87.13 mL/min (ref >60.00)
Glucose, Bld: 81 mg/dL (ref 70–99)
Potassium: 3.7 mEq/L (ref 3.5–5.1)
Sodium: 141 mEq/L (ref 135–145)
Total Bilirubin: 0.5 mg/dL (ref 0.2–1.2)
Total Protein: 7.4 g/dL (ref 6.0–8.3)

## 2022-12-27 LAB — LIPID PANEL
Cholesterol: 206 mg/dL — ABNORMAL HIGH (ref 0–200)
HDL: 58.5 mg/dL (ref >39.00)
LDL Cholesterol: 123 mg/dL — ABNORMAL HIGH (ref 0–99)
NonHDL: 147.83
Total CHOL/HDL Ratio: 4
Triglycerides: 124 mg/dL (ref 0.0–149.0)
VLDL: 24.8 mg/dL (ref 0.0–40.0)

## 2023-01-02 NOTE — Progress Notes (Signed)
Kidney and liver results are good. Improvement in total cholesterol compared to last time but elevated LDL.

## 2023-01-03 ENCOUNTER — Encounter: Payer: Self-pay | Admitting: Nurse Practitioner

## 2023-01-03 ENCOUNTER — Telehealth: Payer: Self-pay | Admitting: Nurse Practitioner

## 2023-01-03 ENCOUNTER — Ambulatory Visit: Payer: BC Managed Care – PPO | Admitting: Nurse Practitioner

## 2023-01-03 VITALS — BP 118/76 | HR 85 | Temp 98.1°F | Ht 63.0 in | Wt 185.8 lb

## 2023-01-03 DIAGNOSIS — Z6832 Body mass index (BMI) 32.0-32.9, adult: Secondary | ICD-10-CM | POA: Diagnosis not present

## 2023-01-03 DIAGNOSIS — E669 Obesity, unspecified: Secondary | ICD-10-CM | POA: Diagnosis not present

## 2023-01-03 MED ORDER — SAXENDA 18 MG/3ML ~~LOC~~ SOPN: 0.6000 mg | PEN_INJECTOR | Freq: Every day | SUBCUTANEOUS | 3 refills | Status: DC

## 2023-01-03 NOTE — Telephone Encounter (Signed)
Patient called and her pharmacy needs a Prior Auth for her Liraglutide -Weight Management (SAXENDA) 18 MG/3ML SOPN

## 2023-01-03 NOTE — Patient Instructions (Addendum)
Rx for Saxenda sent, Inject 0.6 mg daily or a week and increase 0.6 mg weekly.  Follow up in 1 month after starting the medication.

## 2023-01-03 NOTE — Progress Notes (Signed)
Established Patient Office Visit  Subjective:  Patient ID: Krista Ferguson, female    DOB: [birth year]  Age: 47 y.o. MRN: [id number]  CC:  Chief Complaint  Patient presents with   Acute Visit    Discuss Saxenda     HPI  Krista Ferguson presents to discuss weight loss medication.  She denies any family or personal history of thyroid tumor or cancer.  HPI   Past Medical History:  Diagnosis Date   Arthritis    Psosoratic   Atypical mole    lower abd, lower back ?   GERD (gastroesophageal reflux disease)     Past Surgical History:  Procedure Laterality Date   ABDOMINAL HYSTERECTOMY     Partial   CESAREAN SECTION  1995   CHOLECYSTECTOMY     COLONOSCOPY WITH PROPOFOL N/A 05/03/2022   Procedure: COLONOSCOPY WITH PROPOFOL;  Surgeon: Lin Landsman, MD;  Location: [facility] ENDOSCOPY;  Service: Gastroenterology;  Laterality: N/A;   LAPAROSCOPIC GASTRIC SLEEVE RESECTION N/A 03/02/2018   Procedure: LAPAROSCOPIC GASTRIC SLEEVE RESECTION WITH HIATAL HERNIA REPAIR AND UPPER ENDOSCOPY;  Surgeon: Johnathan Hausen, MD;  Location: [facility] ORS;  Service: General;  Laterality: N/A;   SINUSOTOMY  2011    Family History  Problem Relation Age of Onset   Hypertension Other    Diabetes Other    Stroke Other    Cancer Other    Asthma Other    COPD Other    Breast cancer Maternal Aunt    Breast cancer Maternal Grandmother     Social History   Socioeconomic History   Marital status: Divorced    Spouse name: Not on file   Number of children: Not on file   Years of education: Not on file   Highest education level: Not on file  Occupational History   Not on file  Tobacco Use   Smoking status: Never   Smokeless tobacco: Never  Vaping Use   Vaping Use: Never used  Substance and Sexual Activity   Alcohol use: Yes    Comment: Rare-2 times a year   Drug use: Never   Sexual activity: Yes  Other Topics Concern   Not on file  Social History Narrative   Not on file    Social Determinants of Health   Financial Resource Strain: Not on file  Food Insecurity: Not on file  Transportation Needs: Not on file  Physical Activity: Not on file  Stress: Not on file  Social Connections: Not on file  Intimate Partner Violence: Not on file     Outpatient Medications Prior to Visit  Medication Sig Dispense Refill   Calcipotriene-Betameth Diprop 0.005-0.064 % FOAM Apply 1 application topically daily as needed (for psoriasis).      finasteride (PROSCAR) 5 MG tablet TAKE 1 TABLET BY MOUTH EVERY DAY 90 tablet 0   hydrochlorothiazide (MICROZIDE) 12.5 MG capsule Take 1 capsule (12.5 mg total) by mouth daily. 30 capsule 1   ketoconazole (NIZORAL) 2 % shampoo Apply 1 application. topically 2 (two) times a week. Wash scalp 2 times weekly, let sit 5 minutes and rinse out 120 mL 6   minoxidil (LONITEN) 2.5 MG tablet Take 1 tablet (2.5 mg total) by mouth daily. 90 tablet 1   modafinil (PROVIGIL) 100 MG tablet Take 50 mg by mouth 2 (two) times daily.     omeprazole (PRILOSEC) 20 MG capsule TAKE 1 CAPSULE BY MOUTH EVERY DAY 90 capsule 2   Tapinarof (VTAMA) 1 % CREA Apply 1 application  topically in the morning and at bedtime. Apply to affected area ears as needed for psoriasis. 60 g 2   No facility-administered medications prior to visit.    Allergies  Allergen Reactions   Elemental Sulfur Hives   Levaquin [Levofloxacin In D5w] Hives   Sulfa Antibiotics Hives    ROS Review of Systems  Constitutional: Negative.   HENT: Negative.    Eyes: Negative.   Respiratory:  Negative for chest tightness and shortness of breath.   Cardiovascular:  Positive for leg swelling.  Gastrointestinal: Negative.   Endocrine: Negative.   Genitourinary: Negative.   Musculoskeletal:  Positive for arthralgias and joint swelling.  Skin: Negative.   Neurological:  Negative for dizziness, facial asymmetry and headaches.  Hematological: Negative.   Psychiatric/Behavioral: Negative.         Objective:    Physical Exam Constitutional:      Appearance: Normal appearance. She is obese.  HENT:     Head: Normocephalic and atraumatic.     Right Ear: Tympanic membrane normal.     Left Ear: Tympanic membrane normal.     Nose: Nose normal.     Mouth/Throat:     Mouth: Mucous membranes are moist.  Cardiovascular:     Rate and Rhythm: Normal rate and regular rhythm.     Pulses: Normal pulses.     Heart sounds: Normal heart sounds.  Pulmonary:     Effort: Pulmonary effort is normal.     Breath sounds: Normal breath sounds.  Abdominal:     General: Bowel sounds are normal.     Palpations: Abdomen is soft.  Musculoskeletal:        General: No swelling.     Comments: Slight ankle edema bilaterally.   Skin:    Coloration: Skin is not jaundiced.     Findings: No erythema.  Neurological:     General: No focal deficit present.     Mental Status: She is alert and oriented to person, place, and time. Mental status is at baseline.  Psychiatric:        Mood and Affect: Mood normal.        Behavior: Behavior normal.        Thought Content: Thought content normal.        Judgment: Judgment normal.     BP 118/76   Pulse 85   Temp 98.1 F (36.7 C)   Ht '5\' 3"'$  (1.6 m)   Wt 185 lb 12.8 oz (84.3 kg)   SpO2 96%   BMI 32.91 kg/m  Wt Readings from Last 3 Encounters:  01/03/23 185 lb 12.8 oz (84.3 kg)  12/13/22 185 lb (83.9 kg)  05/03/22 180 lb (81.6 kg)     Health Maintenance  Topic Date Due   COVID-19 Vaccine (3 - Pfizer risk series) 01/19/2023 (Originally 04/21/2020)   INFLUENZA VACCINE  02/23/2023 (Originally 06/25/2022)   COLONOSCOPY (Pts 45-72yr Insurance coverage will need to be confirmed)  05/03/2032   Hepatitis C Screening  Completed   HIV Screening  Completed   HPV VACCINES  Aged Out   DTaP/Tdap/Td  Discontinued   PAP SMEAR-Modifier  Discontinued    There are no preventive care reminders to display for this patient.  Lab Results  Component Value Date   TSH  1.78 12/27/2022   Lab Results  Component Value Date   WBC 6.1 12/27/2022   HGB 13.7 12/27/2022   HCT 39.8 12/27/2022   MCV 92.2 12/27/2022   PLT 359.0 12/27/2022   Lab  Results  Component Value Date   NA 141 12/27/2022   K 3.7 12/27/2022   CO2 30 12/27/2022   GLUCOSE 81 12/27/2022   BUN 8 12/27/2022   CREATININE 0.81 12/27/2022   BILITOT 0.5 12/27/2022   ALKPHOS 53 12/27/2022   AST 13 12/27/2022   ALT 12 12/27/2022   PROT 7.4 12/27/2022   ALBUMIN 4.1 12/27/2022   CALCIUM 8.9 12/27/2022   ANIONGAP 11 03/02/2018   EGFR 97 03/21/2022   GFR 87.13 12/27/2022   Lab Results  Component Value Date   CHOL 206 (H) 12/27/2022   Lab Results  Component Value Date   HDL 58.50 12/27/2022   Lab Results  Component Value Date   LDLCALC 123 (H) 12/27/2022   Lab Results  Component Value Date   TRIG 124.0 12/27/2022   Lab Results  Component Value Date   CHOLHDL 4 12/27/2022   No results found for: "HGBA1C"    Assessment & Plan:   Problem List Items Addressed This Visit       Other   Class 1 obesity with body mass index (BMI) of 32.0 to 32.9 in adult - Primary    Body mass index is 32.91 kg/m. Prescription of Saxenda sent to pharmacy Advised patient to avoid trans fat, fatty and fried food. Follow a regular physical activity schedule. Went over the risk of chronic diseases with increased weight.           Meds ordered this encounter  Medications   DISCONTD: Liraglutide -Weight Management (SAXENDA) 18 MG/3ML SOPN    Sig: Inject 0.6 mg into the skin daily. Increase 0.6 mg weekly    Dispense:  3 mL    Refill:  3     Follow-up: Return in about 1 month (around 02/01/2023).    Theresia Lo, NP

## 2023-01-06 ENCOUNTER — Other Ambulatory Visit (HOSPITAL_COMMUNITY): Payer: Self-pay

## 2023-01-06 NOTE — Telephone Encounter (Signed)
Pharmacy Patient Advocate Encounter   Received notification from [place] that prior authorization for Saxenda 18MG/3ML pen-injectors is required/requested.  Per Test Claim: PA required   PA submitted on 01/06/23 to (ins) [employer] via CoverMyMeds Key [id number] Status is pending

## 2023-01-08 ENCOUNTER — Other Ambulatory Visit (HOSPITAL_COMMUNITY): Payer: Self-pay

## 2023-01-08 NOTE — Telephone Encounter (Signed)
PA [facility]

## 2023-01-08 NOTE — Telephone Encounter (Signed)
Pt advised.

## 2023-01-08 NOTE — Telephone Encounter (Signed)
Patient Advocate Encounter  Prior Authorization for Saxenda 18MG/3ML pen-injectors has been approved.    Effective dates: 01/06/23 through 05/11/23

## 2023-01-15 ENCOUNTER — Telehealth: Payer: Self-pay | Admitting: Nurse Practitioner

## 2023-01-15 NOTE — Telephone Encounter (Signed)
Pt called in regards of med Liraglutide -Weight Management (SAXENDA) 18 MG/3ML SOPN, as per pt this med its nation wide on back order, and she was wondering if [facility] can prescribed her another med that can be available in pharmacies?? She also has changed her appt to 4/12 due to her not starting this med as of yet.

## 2023-01-17 ENCOUNTER — Other Ambulatory Visit: Payer: Self-pay | Admitting: Nurse Practitioner

## 2023-01-17 ENCOUNTER — Other Ambulatory Visit (HOSPITAL_COMMUNITY): Payer: Self-pay

## 2023-01-17 ENCOUNTER — Telehealth: Payer: Self-pay | Admitting: Nurse Practitioner

## 2023-01-17 MED ORDER — ZEPBOUND 2.5 MG/0.5ML ~~LOC~~ SOAJ
2.5000 mg | SUBCUTANEOUS | 0 refills | Status: DC
Start: 2023-01-17 — End: 2023-03-13

## 2023-01-17 NOTE — Telephone Encounter (Signed)
Have sent Zepbound once a week to [facility] pharmacy.

## 2023-01-17 NOTE — Telephone Encounter (Signed)
Pt need a PA for zepbound

## 2023-01-17 NOTE — Telephone Encounter (Signed)
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

## 2023-01-17 NOTE — Telephone Encounter (Signed)
Patient is needing a PA on Zepbound.   Krista Ferguson,cma

## 2023-01-19 DIAGNOSIS — E669 Obesity, unspecified: Secondary | ICD-10-CM | POA: Insufficient documentation

## 2023-01-19 NOTE — Assessment & Plan Note (Signed)
Body mass index is 32.91 kg/m. Prescription of Saxenda sent to pharmacy Advised patient to avoid trans fat, fatty and fried food. Follow a regular physical activity schedule. Went over the risk of chronic diseases with increased weight.

## 2023-01-20 ENCOUNTER — Other Ambulatory Visit (HOSPITAL_COMMUNITY): Payer: Self-pay

## 2023-01-20 NOTE — Telephone Encounter (Signed)
Pharmacy Patient Advocate Encounter   Received notification that prior authorization for Zepbound 2.'5mg'$ /0.22m is required/requested.  Per Test Claim: PA required for coverage - new drug coverage delay. Product or service not covered.   PA submitted on 01/20/23 to (ins) [employer] C[employer] [id number] is pending

## 2023-01-21 ENCOUNTER — Ambulatory Visit: Payer: BC Managed Care – PPO | Admitting: Dermatology

## 2023-01-21 ENCOUNTER — Encounter: Payer: Self-pay | Admitting: Dermatology

## 2023-01-21 VITALS — BP 110/68 | HR 82

## 2023-01-21 DIAGNOSIS — L409 Psoriasis, unspecified: Secondary | ICD-10-CM | POA: Diagnosis not present

## 2023-01-21 DIAGNOSIS — L659 Nonscarring hair loss, unspecified: Secondary | ICD-10-CM

## 2023-01-21 DIAGNOSIS — L649 Androgenic alopecia, unspecified: Secondary | ICD-10-CM

## 2023-01-21 MED ORDER — FINASTERIDE 5 MG PO TABS: 5.0000 mg | ORAL_TABLET | Freq: Every day | ORAL | 1 refills | Status: DC

## 2023-01-21 MED ORDER — MINOXIDIL 2.5 MG PO TABS: 2.5000 mg | ORAL_TABLET | Freq: Every day | ORAL | 1 refills | Status: DC

## 2023-01-21 NOTE — Patient Instructions (Addendum)
Recommend wearing hat when out in sun or apply sun screen to scalp  Continue Finasteride as directed Continue Minoxidil as directed Can continue Red light   Doses of minoxidil for hair loss are considered 'low dose'. This is because the doses used for hair loss are much lower than the doses which are used for conditions such as high blood pressure (hypertension). The doses used for hypertension are 10-'40mg'$  per day.  Side effects are uncommon at the low doses (up to 2.5 mg/day) used to treat hair loss. Potential side effects, more commonly seen at higher doses, include: Increase in hair growth (hypertrichosis) elsewhere on face and body Temporary hair shedding upon starting medication which may last up to 4 weeks Ankle swelling, fluid retention, rapid weight gain more than 5 pounds Low blood pressure and feeling lightheaded or dizzy when standing up quickly Fast or irregular heartbeat Headaches      Due to recent changes in healthcare laws, you may see results of your pathology and/or laboratory studies on MyChart before the doctors have had a chance to review them. We understand that in some cases there may be results that are confusing or concerning to you. Please understand that not all results are received at the same time and often the doctors may need to interpret multiple results in order to provide you with the best plan of care or course of treatment. Therefore, we ask that you please give [place] 2 business days to thoroughly review all your results before contacting the office for clarification. Should we see a critical lab result, you will be contacted sooner.   If You Need Anything After Your Visit  If you have any questions or concerns for your doctor, please call our main line at [phone] and press option 4 to reach your doctor's medical assistant. If no one answers, please leave a voicemail as directed and we will return your call as soon as possible. Messages left after 4 pm  will be answered the following business day.   You may also send [place] a message via [place]. We typically respond to MyChart messages within 1-2 business days.  For prescription refills, please ask your pharmacy to contact our office. Our fax number is [phone].  If you have an urgent issue when the clinic is closed that cannot wait until the next business day, you can page your doctor at the number below.    Please note that while we do our best to be available for urgent issues outside of office hours, we are not available 24/7.   If you have an urgent issue and are unable to reach [place], you may choose to seek medical care at your doctor's office, retail clinic, urgent care center, or emergency room.  If you have a medical emergency, please immediately call 911 or go to the emergency department.  Pager Numbers  - Dr. Nehemiah Massed: [phone]  - Dr. Laurence Ferrari: [phone]  - Dr. Nicole Kindred: [phone]  In the event of inclement weather, please call our main line at [phone] for an update on the status of any delays or closures.  Dermatology Medication Tips: Please keep the boxes that topical medications come in in order to help keep track of the instructions about where and how to use these. Pharmacies typically print the medication instructions only on the boxes and not directly on the medication tubes.   If your medication is too expensive, please contact our office at [phone] option 4 or send [place] a message through [place].  We are unable to tell what your co-pay for medications will be in advance as this is different depending on your insurance coverage. However, we may be able to find a substitute medication at lower cost or fill out paperwork to get insurance to cover a needed medication.   If a prior authorization is required to get your medication covered by your insurance company, please allow [place] 1-2 business days to complete this process.  Drug prices often vary depending  on where the prescription is filled and some pharmacies may offer cheaper prices.  The website www.goodrx.com contains coupons for medications through different pharmacies. The prices here do not account for what the cost may be with help from insurance (it may be cheaper with your insurance), but the website can give you the price if you did not use any insurance.  - You can print the associated coupon and take it with your prescription to the pharmacy.  - You may also stop by our office during regular business hours and pick up a GoodRx coupon card.  - If you need your prescription sent electronically to a different pharmacy, notify our office through [facility] or by phone at [phone] option 4.     Si Usted Necesita Algo Despus de Su Visita  Tambin puede enviarnos un mensaje a travs de Pharmacist, community. Por lo general respondemos a los mensajes de MyChart en el transcurso de 1 a 2 das hbiles.  Para renovar recetas, por favor pida a su farmacia que se ponga en contacto con nuestra oficina. Harland Dingwall de fax es [place] [phone].  Si tiene un asunto urgente cuando la clnica est cerrada y que no puede esperar hasta el siguiente da hbil, puede llamar/localizar a su doctor(a) al nmero que aparece a continuacin.   Por favor, tenga en cuenta que aunque hacemos todo lo posible para estar disponibles para asuntos urgentes fuera del horario de [place], no estamos disponibles las 24 horas del da, los 7 das de la [place].   Si tiene un problema urgente y no puede comunicarse con nosotros, puede optar por buscar atencin mdica  en el consultorio de su doctor(a), en una clnica privada, en un centro de atencin urgente o en una sala de emergencias.  Si tiene Engineering geologist, por favor llame inmediatamente al 911 o vaya a la sala de emergencias.  Nmeros de bper  - Dr. Nehemiah Massed: [phone]  - Dra. Moye: [phone]  - Dra. Nicole Kindred: [phone]  En caso de  inclemencias del [place], por favor llame a Johnsie Kindred principal al [phone] para una actualizacin sobre el [place] de cualquier retraso o cierre.  Consejos para la medicacin en dermatologa: Por favor, guarde las cajas en las que vienen los medicamentos de uso tpico para ayudarle a seguir las instrucciones sobre dnde y cmo usarlos. Las farmacias generalmente imprimen las instrucciones del medicamento slo en las cajas y no directamente en los tubos del [place].   Si su medicamento es muy caro, por favor, pngase en contacto con Zigmund Daniel llamando al [phone] y presione la opcin 4 o envenos un mensaje a travs de Pharmacist, community.   No podemos decirle cul ser su copago por los medicamentos por adelantado ya que esto es diferente dependiendo de la cobertura de su seguro. Sin embargo, es posible que podamos encontrar un medicamento sustituto a Electrical engineer un formulario para que el seguro cubra el medicamento que se considera necesario.   Si se requiere una autorizacin previa para que su  compaa de seguros [place] su medicamento, por favor permtanos de 1 a 2 das hbiles para completar este proceso.  Los precios de los medicamentos varan con frecuencia dependiendo del Environmental consultant de dnde se surte la receta y alguna farmacias pueden ofrecer precios ms baratos.  El sitio web www.goodrx.com tiene cupones para medicamentos de Airline pilot. Los precios aqu no tienen en cuenta lo que podra costar con la ayuda del seguro (puede ser ms barato con su seguro), pero el sitio web puede darle el precio si no utiliz Research scientist (physical sciences).  - Puede imprimir el cupn correspondiente y llevarlo con su receta a la farmacia.  - Tambin puede pasar por nuestra oficina durante el horario de atencin regular y Charity fundraiser una tarjeta de cupones de GoodRx.  - Si necesita que su receta se enve electrnicamente a una farmacia diferente, informe a nuestra oficina a travs de MyChart de [place] o  por telfono llamando al [phone] y presione la opcin 4.

## 2023-01-21 NOTE — Progress Notes (Signed)
Follow-Up Visit   Subjective  Krista Ferguson is a 47 y.o. female who presents for the following: Alopecia (Patient here for follow up on hair loss. Currently taking minoxidil 2.5 mg tab daily and finasteride 5 mg by mouth daily. She has noticed some new hair growth.). No side effects from medication. Also f/up psoriasis.  Uses ketoconazole shampoo and Vtama cream.  Has done Xtrac treatment in past with good results at scalp.  The following portions of the chart were reviewed this encounter and updated as appropriate:      Review of Systems: No other skin or systemic complaints except as noted in HPI or Assessment and Plan.   Objective  Well appearing patient in no apparent distress; mood and affect are within normal limits.  A focused examination was performed including scalp. Relevant physical exam findings are noted in the Assessment and Plan.  Scalp Frontal scalp thinning with intact frontal hairline and miniaturization, some improvement when compared to baseline photos   scalp and b/l ears Erythema and mild scale at right posterior auricular and b/l ear canals.  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 Condition is symptomatic/ bothersome to patient. Improving but not currently at goal.    Female Androgenic Alopecia is a chronic condition related to genetics and/or hormonal changes.  In women androgenetic alopecia is commonly associated with menopause but may occur any time after puberty.  It causes hair thinning primarily on the crown with widening of the part and temporal hairline recession.  Can use OTC Rogaine (minoxidil) 5% solution/foam as directed.  Oral treatments in female patients who have no contraindication may include : - Low dose oral minoxidil 1.25 - '5mg'$  daily - Spironolactone 50 - '100mg'$  bid - Finasteride  2.5 - 5 mg daily Adjunctive therapies include: - Low Level Laser Light Therapy (LLLT) - Platelet-rich plasma injections (PRP) - Hair Transplants or scalp reduction   BP 110/68   Continue Minoxidil 2.'5mg'$ ,1 po qd Cont Finasteride '5mg'$  1 po qd Cont Red light therapy 2x/week.  Pt uses IRestore helmet   Doses of minoxidil for hair loss are considered 'low dose'. This is because the doses used for hair loss are a lot lower than the doses which are used for conditions such as high blood pressure (hypertension). The doses used for hypertension are 10-'40mg'$  per day.  Side effects are uncommon at the low doses (up to 2.5 mg/day) used to treat hair loss. Potential side effects, more commonly seen at higher doses, include: Increase in hair growth (hypertrichosis) elsewhere on face and body Temporary hair shedding upon starting medication which may last up to 4 weeks Ankle swelling, fluid retention, rapid weight gain more than 5 pounds Low blood pressure and feeling lightheaded or dizzy when standing up quickly Fast or irregular heartbeat Headaches  Psoriasis scalp and b/l ears  Chronic condition with duration or expected duration over one year. Currently well-controlled.   Counseling on psoriasis and coordination of care  psoriasis is a chronic non-curable, but treatable genetic/hereditary disease that may have other systemic features affecting other organ systems such as joints (Psoriatic Arthritis). It is associated with an increased risk of inflammatory bowel disease, heart disease, non-alcoholic fatty liver disease, and depression.  Treatments include light and laser treatments; topical medications; and systemic medications including oral and injectables.    Continue Vtama 1 %  cream apply topically to aa daily  Continue Ketoconazole 2 % shampoo - apply topically 2 times a week. Wash scalp 2 times weekly let sit 5 minutes and rinse out.    Related Medications Tapinarof (VTAMA) 1 % CREA Apply  1 application topically in the morning and at bedtime. Apply to affected area ears as needed for psoriasis.  ketoconazole (NIZORAL) 2 % shampoo Apply 1 application. topically 2 (two) times a week. Wash scalp 2 times weekly, let sit 5 minutes and rinse out  Alopecia  Related Medications finasteride (PROSCAR) 5 MG tablet Take 1 tablet (5 mg total) by mouth daily.  Androgenetic alopecia  Related Medications minoxidil (LONITEN) 2.5 MG tablet Take 1 tablet (2.5 mg total) by mouth daily.   Return in about 6 months (around 07/22/2023) for alopecia / psoriasis follow up. I, Ruthell Rummage, CMA, am acting as scribe for Brendolyn Patty, MD.  Documentation: I have reviewed the above documentation for accuracy and completeness, and I agree with the above.  Brendolyn Patty MD

## 2023-02-03 ENCOUNTER — Other Ambulatory Visit (HOSPITAL_COMMUNITY): Payer: Self-pay

## 2023-02-03 ENCOUNTER — Telehealth: Payer: Self-pay

## 2023-02-03 NOTE — Telephone Encounter (Signed)
Mychart message sent to pt to advise Zepbound denied

## 2023-02-03 NOTE — Telephone Encounter (Signed)
noted 

## 2023-02-03 NOTE — Telephone Encounter (Signed)
Pharmacy Patient Advocate Encounter  Received notification from [facility] that the request for prior authorization for Zepbound 2.'5MG'$ /0.5ML pen-injectors has been denied due to not meeting the definition of medical necessity.      Please be advised we currently do not have a Pharmacist to review denials, therefore you will need to process appeals accordingly as needed. Thanks for your support at this time.   You may call [phone] ext [phone] or fax [phone], to appeal. E-appeal is also available. Key: [id number]

## 2023-02-03 NOTE — Telephone Encounter (Signed)
ERROR

## 2023-02-04 NOTE — Telephone Encounter (Signed)
Can we please file an appeal. She has history of hypertension and hyperlipidemia.

## 2023-02-05 ENCOUNTER — Other Ambulatory Visit: Payer: Self-pay | Admitting: Nurse Practitioner

## 2023-02-07 ENCOUNTER — Ambulatory Visit: Payer: BC Managed Care – PPO | Admitting: Nurse Practitioner

## 2023-02-07 NOTE — Telephone Encounter (Signed)
Appeal sent by fax to [facility], confirmation given.  Krista Ferguson,cma

## 2023-02-10 ENCOUNTER — Other Ambulatory Visit (HOSPITAL_COMMUNITY): Payer: Self-pay

## 2023-02-10 NOTE — Telephone Encounter (Signed)
Pharmacy Patient Advocate Encounter  Prior Authorization for [facility] has been approved by [place] (ins).    PA # [id number] Effective dates: 01/20/2023 through 05/26/2023  Spoke with Pharmacy to process. Copay is 24.99

## 2023-02-11 NOTE — Telephone Encounter (Signed)
Thank you :)

## 2023-02-15 ENCOUNTER — Ambulatory Visit
Admission: EM | Admit: 2023-02-15 | Discharge: 2023-02-15 | Disposition: A | Discharge status: Home / Self Care | Payer: BC Managed Care – PPO | Attending: Emergency Medicine | Admitting: Emergency Medicine

## 2023-02-15 DIAGNOSIS — R591 Generalized enlarged lymph nodes: Secondary | ICD-10-CM | POA: Diagnosis not present

## 2023-02-15 MED ORDER — PREDNISONE 10 MG (21) PO TBPK: ORAL_TABLET | Freq: Every day | ORAL | 0 refills | Status: DC

## 2023-02-15 NOTE — Discharge Instructions (Addendum)
Take the prednisone taper as directed.    Take Benadryl or Zyrtec as directed.    Follow up with your primary care provider on Monday.   Call 911 and go to the emergency department if you have difficulty swallowing or breathing.

## 2023-02-15 NOTE — ED Provider Notes (Signed)
Krista Ferguson    CSN: [id number] Arrival date & time: 02/15/23  [id number]      History   Chief Complaint Chief Complaint  Patient presents with   Dysphagia    HPI Krista Ferguson is a 47 y.o. female.  Patient presents with 2 day history of left side throat swelling.  This started when she ate a new brand of pickle on 02/13/2023; she felt swelling in the back of her throat; she took Benadryl and improved.  Since then, every time she eats, she feels increased swelling and difficulty swallowing.  She denies fever, chills, rash, voice change, cough, shortness of breath, or other symptoms.  Her medical history includes seasonal allergies, hypertension, metabolic syndrome.  The history is provided by the patient and medical records.    Past Medical History:  Diagnosis Date   Arthritis    Psosoratic   Atypical mole    lower abd, lower back ?   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/2020   Panniculitis 08/18/2020   Acute non-recurrent maxillary sinusitis 07/05/2020   Alopecia 07/05/2020   Acute recurrent maxillary sinusitis 07/05/2020   Candidiasis 07/05/2020   Yeast dermatitis [id number]   Metabolic syndrome [id number]   Psoriasis, unspecified 06/15/2020   Seasonal allergic rhinitis due to pollen 04/13/2020   Essential hypertension 04/13/2020   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    Past Surgical History:  Procedure Laterality Date   ABDOMINAL HYSTERECTOMY     Partial   CESAREAN SECTION  1995   CHOLECYSTECTOMY     COLONOSCOPY WITH PROPOFOL N/A 05/03/2022   Procedure: COLONOSCOPY WITH PROPOFOL;  Surgeon: Lin Landsman, MD;  Location: [facility];  Service:  Gastroenterology;  Laterality: N/A;   LAPAROSCOPIC GASTRIC SLEEVE RESECTION N/A 03/02/2018   Procedure: LAPAROSCOPIC GASTRIC SLEEVE RESECTION WITH HIATAL HERNIA REPAIR AND UPPER ENDOSCOPY;  Surgeon: Johnathan Hausen, MD;  Location: [facility];  Service: General;  Laterality: N/A;   SINUSOTOMY  2011    OB History   No obstetric history on file.      Home Medications    Prior to Admission medications   Medication Sig Start Date End Date Taking? Authorizing Provider  predniSONE (STERAPRED UNI-PAK 21 TAB) 10 MG (21) TBPK tablet Take by mouth daily. As directed 02/15/23  Yes Sharion Balloon, NP  Calcipotriene-Betameth Diprop 0.005-0.064 % FOAM Apply 1 application topically daily as needed (for psoriasis).     [provider]  finasteride (PROSCAR) 5 MG tablet Take 1 tablet (5 mg total) by mouth daily. 01/21/23   Brendolyn Patty, MD  hydrochlorothiazide (MICROZIDE) 12.5 MG capsule TAKE 1 CAPSULE BY MOUTH EVERY DAY 02/05/23   Theresia Lo, NP  ketoconazole (NIZORAL) 2 % shampoo Apply 1 application. topically 2 (two) times a week. Wash scalp 2 times weekly, let sit 5 minutes and rinse out 03/18/22   Brendolyn Patty, MD  minoxidil (LONITEN) 2.5 MG tablet Take 1 tablet (2.5 mg total) by mouth daily. 01/21/23   Brendolyn Patty, MD  modafinil (PROVIGIL) 100 MG tablet Take 50 mg by mouth 2 (two) times daily. 02/22/22   [provider]  omeprazole (PRILOSEC) 20 MG capsule TAKE 1 CAPSULE BY MOUTH EVERY DAY 08/27/22  Masoud, Viann Shove, MD  Tapinarof (VTAMA) 1 % CREA Apply 1 application topically in the morning and at bedtime. Apply to affected area ears as needed for psoriasis. 12/24/21   Brendolyn Patty, MD  tirzepatide [facility]) 2.5 MG/0.5ML Pen Inject 2.5 mg into the skin once a week. 01/17/23   Theresia Lo, NP    Family History Family History  Problem Relation Age of Onset   Hypertension Other    Diabetes Other    Stroke Other    Cancer Other    Asthma Other    COPD Other    Breast cancer  Maternal Aunt    Breast cancer Maternal Grandmother     Social History Social History   Tobacco Use   Smoking status: Never   Smokeless tobacco: Never  Vaping Use   Vaping Use: Never used  Substance Use Topics   Alcohol use: Yes    Comment: Rare-2 times a year   Drug use: Never     Allergies   Elemental sulfur, Levaquin [levofloxacin in d5w], and Sulfa antibiotics   Review of Systems Review of Systems  Constitutional:  Negative for chills and fever.  HENT:  Positive for sore throat and trouble swallowing. Negative for ear pain and voice change.   Respiratory:  Negative for cough and shortness of breath.   Cardiovascular:  Negative for chest pain and palpitations.  Skin:  Negative for color change and rash.  All other systems reviewed and are negative.    Physical Exam Triage Vital Signs ED Triage Vitals  Enc Vitals Group     BP      Pulse      Resp      Temp      Temp src      SpO2      Weight      Height      Head Circumference      Peak Flow      Pain Score      Pain Loc      Pain Edu?      Excl. in [place]?    No data found.  Updated Vital Signs BP 100/70   Pulse 77   Temp 98.2 F (36.8 C)   Resp 18   SpO2 96%   Visual Acuity Right Eye Distance:   Left Eye Distance:   Bilateral Distance:    Right Eye Near:   Left Eye Near:    Bilateral Near:     Physical Exam Vitals and nursing note reviewed.  Constitutional:      General: She is not in acute distress.    Appearance: Normal appearance. She is well-developed. She is not ill-appearing.  HENT:     Right Ear: Tympanic membrane normal.     Left Ear: Tympanic membrane normal.     Nose: Nose normal.     Mouth/Throat:     Mouth: Mucous membranes are moist.     Pharynx: Oropharynx is clear.     Tonsils: 0 on the right. 1+ on the left.     Comments: Voice clear. No difficulty swallowing.  Neck:     Comments: Enlarged left submandibular lymph node.  Cardiovascular:     Rate and Rhythm:  Normal rate and regular rhythm.     Heart sounds: Normal heart sounds.  Pulmonary:     Effort: Pulmonary effort is normal. No respiratory distress.     Breath sounds: Normal breath sounds.     Comments: Good air movement. Musculoskeletal:  Cervical back: Normal range of motion and neck supple. No rigidity or tenderness.  Lymphadenopathy:     Cervical: Cervical adenopathy present.  Skin:    General: Skin is warm and dry.  Neurological:     Mental Status: She is alert.  Psychiatric:        Mood and Affect: Mood normal.        Behavior: Behavior normal.      UC Treatments / Results  Labs (all labs ordered are listed, but only abnormal results are displayed) Labs Reviewed - No data to display  EKG   Radiology No results found.  Procedures Procedures (including critical care time)  Medications Ordered in UC Medications - No data to display  Initial Impression / Assessment and Plan / UC Course  I have reviewed the triage vital signs and the nursing notes.  Pertinent labs & imaging results that were available during my care of the patient were reviewed by me and considered in my medical decision making (see chart for details).   Lymphadenopathy.  Afebrile, VSS.  No difficulty swallowing or breathing. Treating with prednisone taper and antihistamine such as Zyrtec or Benadryl.  911 and ED precautions discussed.  Education provided on lymphadenopathy.  Instructed patient to follow up with her PCP on Monday.  She agrees to plan of care.    Final Clinical Impressions(s) / UC Diagnoses   Final diagnoses:  Lymphadenopathy     Discharge Instructions      Take the prednisone taper as directed.    Take Benadryl or Zyrtec as directed.    Follow up with your primary care provider on Monday.   Call 911 and go to the emergency department if you have difficulty swallowing or breathing.         ED Prescriptions     Medication Sig Dispense Auth. Provider    predniSONE (STERAPRED UNI-PAK 21 TAB) 10 MG (21) TBPK tablet Take by mouth daily. As directed 21 tablet Sharion Balloon, NP      PDMP not reviewed this encounter.   Sharion Balloon, NP 02/15/23 [phone]

## 2023-02-15 NOTE — ED Triage Notes (Signed)
Patient to Urgent Care with complaints of left sided neck/ throat swelling.   Reports that on thursday she ate a different brand of pickles. Felt like she was having an allergic reaction. Took benadryl and symptoms improved. Reports still having some difficulty swallowing due to swelling on the left side of her throat. Reports when she eats the left side the slowly becomes more swollen. Reports some discomfort.   Still taking benadryl/ otc allergy medications

## 2023-02-19 NOTE — Progress Notes (Signed)
Established Patient Office Visit  Subjective:  Patient ID: Krista Ferguson, female    DOB: [birth year]  Age: 47 y.o. MRN: [id number]  CC:  Chief Complaint  Patient presents with   Acute Visit    02/13/23 allergic reaction to a pickle-swollen lymph nodes and trouble swollering     HPI  Krista Ferguson presents for swollen lymphopathy. She had allergic reaction on 02/13/23 after eating a different brand a pickle. She was seen at the urgent care on 02/15/23 and started on prednisone.   She is doing better now. No difficulty swallowing. Still have some tenderness to touch at submandibular lymph node. She has completed the predinosne and still taking zyrtec during the day and benadryl at night.    HPI   Past Medical History:  Diagnosis Date   Arthritis    Psosoratic   Atypical mole    lower abd, lower back ?   GERD (gastroesophageal reflux disease)     Past Surgical History:  Procedure Laterality Date   ABDOMINAL HYSTERECTOMY     Partial   CESAREAN SECTION  1995   CHOLECYSTECTOMY     COLONOSCOPY WITH PROPOFOL N/A 05/03/2022   Procedure: COLONOSCOPY WITH PROPOFOL;  Surgeon: Lin Landsman, MD;  Location: [facility];  Service: Gastroenterology;  Laterality: N/A;   LAPAROSCOPIC GASTRIC SLEEVE RESECTION N/A 03/02/2018   Procedure: LAPAROSCOPIC GASTRIC SLEEVE RESECTION WITH HIATAL HERNIA REPAIR AND UPPER ENDOSCOPY;  Surgeon: Johnathan Hausen, MD;  Location: [facility];  Service: General;  Laterality: N/A;   SINUSOTOMY  2011    Family History  Problem Relation Age of Onset   Hypertension Other    Diabetes Other    Stroke Other    Cancer Other    Asthma Other    COPD Other    Breast cancer Maternal Aunt    Breast cancer Maternal Grandmother     Social History   Socioeconomic History   Marital status: Divorced    Spouse name: Not on file   Number of children: Not on file   Years of education: Not on file   Highest education level: Not on file   Occupational History   Not on file  Tobacco Use   Smoking status: Never   Smokeless tobacco: Never  Vaping Use   Vaping Use: Never used  Substance and Sexual Activity   Alcohol use: Yes    Comment: Rare-2 times a year   Drug use: Never   Sexual activity: Yes  Other Topics Concern   Not on file  Social History Narrative   Not on file   Social Determinants of Health   Financial Resource Strain: Not on file  Food Insecurity: Not on file  Transportation Needs: Not on file  Physical Activity: Not on file  Stress: Not on file  Social Connections: Not on file  Intimate Partner Violence: Not on file     Outpatient Medications Prior to Visit  Medication Sig Dispense Refill   Calcipotriene-Betameth Diprop 0.005-0.064 % FOAM Apply 1 application topically daily as needed (for psoriasis).      finasteride (PROSCAR) 5 MG tablet Take 1 tablet (5 mg total) by mouth daily. 90 tablet 1   hydrochlorothiazide (MICROZIDE) 12.5 MG capsule TAKE 1 CAPSULE BY MOUTH EVERY DAY 30 capsule 1   ketoconazole (NIZORAL) 2 % shampoo Apply 1 application. topically 2 (two) times a week. Wash scalp 2 times weekly, let sit 5 minutes and rinse out 120 mL 6   minoxidil (LONITEN) 2.5 MG  tablet Take 1 tablet (2.5 mg total) by mouth daily. 90 tablet 1   modafinil (PROVIGIL) 100 MG tablet Take 50 mg by mouth 2 (two) times daily.     omeprazole (PRILOSEC) 20 MG capsule TAKE 1 CAPSULE BY MOUTH EVERY DAY 90 capsule 2   predniSONE (STERAPRED UNI-PAK 21 TAB) 10 MG (21) TBPK tablet Take by mouth daily. As directed 21 tablet 0   Tapinarof (VTAMA) 1 % CREA Apply 1 application topically in the morning and at bedtime. Apply to affected area ears as needed for psoriasis. 60 g 2   tirzepatide (ZEPBOUND) 2.5 MG/0.5ML Pen Inject 2.5 mg into the skin once a week. 2 mL 0   No facility-administered medications prior to visit.    Allergies  Allergen Reactions   Elemental Sulfur Hives   Levaquin [Levofloxacin In D5w] Hives    Sulfa Antibiotics Hives    ROS Review of Systems  Constitutional: Negative.   HENT: Negative.    Eyes: Negative.   Respiratory:  Negative for chest tightness and shortness of breath.   Gastrointestinal: Negative.   Musculoskeletal: Negative.   Skin: Negative.   Neurological:  Negative for dizziness, facial asymmetry and headaches.  Psychiatric/Behavioral: Negative.        Objective:    Physical Exam Constitutional:      Appearance: Normal appearance. She is obese.  HENT:     Head: Normocephalic and atraumatic.     Right Ear: Tympanic membrane normal.     Left Ear: Tympanic membrane normal.     Nose: Nose normal.     Mouth/Throat:     Mouth: Mucous membranes are moist.     Tonsils: 0 on the right. 0 on the left.     Comments: Submandibular lymph node tenderness Cardiovascular:     Rate and Rhythm: Normal rate and regular rhythm.     Pulses: Normal pulses.     Heart sounds: Normal heart sounds.  Pulmonary:     Effort: Pulmonary effort is normal.     Breath sounds: Normal breath sounds.  Neurological:     General: No focal deficit present.     Mental Status: She is alert and oriented to person, place, and time. Mental status is at baseline.  Psychiatric:        Mood and Affect: Mood normal.        Behavior: Behavior normal.        Thought Content: Thought content normal.        Judgment: Judgment normal.     BP 118/74   Pulse 71   Temp 98.1 F (36.7 C)   Ht 5\' 3"  (1.6 m)   Wt 184 lb (83.5 kg)   SpO2 98%   BMI 32.59 kg/m  Wt Readings from Last 3 Encounters:  02/20/23 184 lb (83.5 kg)  01/03/23 185 lb 12.8 oz (84.3 kg)  12/13/22 185 lb (83.9 kg)     Health Maintenance  Topic Date Due   INFLUENZA VACCINE  02/23/2023 (Originally 06/25/2022)   COVID-19 Vaccine (3 - Pfizer risk series) 03/08/2023 (Originally 04/21/2020)   COLONOSCOPY (Pts 45-38yrs Insurance coverage will need to be confirmed)  05/03/2032   Hepatitis C Screening  Completed   HIV Screening   Completed   HPV VACCINES  Aged Out   DTaP/Tdap/Td  Discontinued   PAP SMEAR-Modifier  Discontinued    There are no preventive care reminders to display for this patient.  Lab Results  Component Value Date   TSH 1.78 12/27/2022  Lab Results  Component Value Date   WBC 6.1 12/27/2022   HGB 13.7 12/27/2022   HCT 39.8 12/27/2022   MCV 92.2 12/27/2022   PLT 359.0 12/27/2022   Lab Results  Component Value Date   NA 141 12/27/2022   K 3.7 12/27/2022   CO2 30 12/27/2022   GLUCOSE 81 12/27/2022   BUN 8 12/27/2022   CREATININE 0.81 12/27/2022   BILITOT 0.5 12/27/2022   ALKPHOS 53 12/27/2022   AST 13 12/27/2022   ALT 12 12/27/2022   PROT 7.4 12/27/2022   ALBUMIN 4.1 12/27/2022   CALCIUM 8.9 12/27/2022   ANIONGAP 11 03/02/2018   EGFR 97 03/21/2022   GFR 87.13 12/27/2022   Lab Results  Component Value Date   CHOL 206 (H) 12/27/2022   Lab Results  Component Value Date   HDL 58.50 12/27/2022   Lab Results  Component Value Date   LDLCALC 123 (H) 12/27/2022   Lab Results  Component Value Date   TRIG 124.0 12/27/2022   Lab Results  Component Value Date   CHOLHDL 4 12/27/2022   No results found for: "HGBA1C"    Assessment & Plan:   Problem List Items Addressed This Visit       Immune and Lymphatic   Lymphadenopathy - Primary    Slight tenderness of left submandibular lymph node. Continue zyrtec at day and benadryl at night. Will continue to monitor.        No orders of the defined types were placed in this encounter.    Follow-up: No follow-ups on file.    Theresia Lo, NP

## 2023-02-20 ENCOUNTER — Encounter: Payer: Self-pay | Admitting: Nurse Practitioner

## 2023-02-20 ENCOUNTER — Ambulatory Visit: Payer: BC Managed Care – PPO | Admitting: Nurse Practitioner

## 2023-02-20 VITALS — BP 118/74 | HR 71 | Temp 98.1°F | Ht 63.0 in | Wt 184.0 lb

## 2023-02-20 DIAGNOSIS — R591 Generalized enlarged lymph nodes: Secondary | ICD-10-CM

## 2023-02-20 NOTE — Patient Instructions (Signed)
Continue Zyrtec at day and benadryl at night. We will recheck at the upcoming visit in 3 weeks.

## 2023-02-22 ENCOUNTER — Encounter: Payer: Self-pay | Admitting: Nurse Practitioner

## 2023-02-22 DIAGNOSIS — R591 Generalized enlarged lymph nodes: Secondary | ICD-10-CM | POA: Insufficient documentation

## 2023-02-22 NOTE — Assessment & Plan Note (Addendum)
Slight tenderness of left submandibular lymph node. Continue zyrtec at day and benadryl at night. Will continue to monitor.

## 2023-03-07 ENCOUNTER — Ambulatory Visit: Payer: BC Managed Care – PPO | Admitting: Nurse Practitioner

## 2023-03-13 ENCOUNTER — Ambulatory Visit: Payer: BC Managed Care – PPO | Admitting: Nurse Practitioner

## 2023-03-13 ENCOUNTER — Encounter: Payer: Self-pay | Admitting: Nurse Practitioner

## 2023-03-13 VITALS — BP 122/76 | HR 62 | Temp 98.3°F | Ht 63.0 in | Wt 178.2 lb

## 2023-03-13 DIAGNOSIS — R591 Generalized enlarged lymph nodes: Secondary | ICD-10-CM | POA: Diagnosis not present

## 2023-03-13 DIAGNOSIS — Z6832 Body mass index (BMI) 32.0-32.9, adult: Secondary | ICD-10-CM | POA: Diagnosis not present

## 2023-03-13 DIAGNOSIS — E669 Obesity, unspecified: Secondary | ICD-10-CM

## 2023-03-13 MED ORDER — ZEPBOUND 5 MG/0.5ML ~~LOC~~ SOAJ: 5.0000 mg | SUBCUTANEOUS | 5 refills | Status: DC

## 2023-03-13 MED ORDER — OMEPRAZOLE 20 MG PO CPDR
DELAYED_RELEASE_CAPSULE | ORAL | 1 refills | Status: DC
Start: 2023-03-13 — End: 2023-03-13

## 2023-03-13 MED ORDER — OMEPRAZOLE 20 MG PO CPDR: DELAYED_RELEASE_CAPSULE | ORAL | 1 refills | Status: AC

## 2023-03-13 NOTE — Patient Instructions (Addendum)
Rx sent to pharmacy. Increased zepbound to 5 mg once a week.  Follow up in 1  month for physical

## 2023-03-13 NOTE — Progress Notes (Signed)
Established Patient Office Visit  Subjective:  Patient ID: Krista Ferguson, female    DOB: February 02, 1976  Age: 47 y.o. MRN: [id number]  CC:  Chief Complaint  Patient presents with   Medical Management of Chronic Issues    HPI  Krista Ferguson presents for follow up on weight lose and  lymphadenopathy.  She denies any any adenopathy or tenderness at present.  She has lost 6 pounds. since her last visit.  She denies any swelling, chest pain, constipation or thyroid issues present.  HPI   Past Medical History:  Diagnosis Date   Arthritis    Psosoratic   Atypical mole    lower abd, lower back ?   GERD (gastroesophageal reflux disease)     Past Surgical History:  Procedure Laterality Date   ABDOMINAL HYSTERECTOMY     Partial   CESAREAN SECTION  1995   CHOLECYSTECTOMY     COLONOSCOPY WITH PROPOFOL N/A 05/03/2022   Procedure: COLONOSCOPY WITH PROPOFOL;  Surgeon: Toney Reil, MD;  Location: [facility] END[facility]COPY;  Service: Gastroenterology;  Laterality: N/A;   LAPAROSCOPIC GASTRIC SLEEVE RESECTION N/A 03/02/2018   Procedure: LAPAROSCOPIC GASTRIC SLEEVE RESECTION WITH HIATAL HERNIA REPAIR AND UPPER ENDOSCOPY;  Surgeon: Luretha Murphy, MD;  Location: [facility];  Service: General;  Laterality: N/A;   SINUSOTOMY  2011    Family History  Problem Relation Age of Onset   Hypertension Other    Diabetes Other    Stroke Other    Cancer Other    Asthma Other    COPD Other    Breast cancer Maternal Aunt    Breast cancer Maternal Grandmother     Social History   Socioeconomic History   Marital status: Divorced    Spouse name: Not on file   Number of children: Not on file   Years of education: Not on file   Highest education level: Not on file  Occupational History   Not on file  Tobacco Use   Smoking status: Never   Smokeless tobacco: Never  Vaping Use   Vaping Use: Never used  Substance and Sexual Activity   Alcohol use: Yes    Comment: Rare-2 times a year    Drug use: Never   Sexual activity: Yes  Other Topics Concern   Not on file  Social History Narrative   Not on file   Social Determinants of Health   Financial Resource Strain: Not on file  Food Insecurity: Not on file  Transportation Needs: Not on file  Physical Activity: Not on file  Stress: Not on file  Social Connections: Not on file  Intimate Partner Violence: Not on file     Outpatient Medications Prior to Visit  Medication Sig Dispense Refill   Calcipotriene-Betameth Diprop 0.005-0.064 % FOAM Apply 1 application topically daily as needed (for psoriasis).      finasteride (PROSCAR) 5 MG tablet Take 1 tablet (5 mg total) by mouth daily. 90 tablet 1   hydrochlorothiazide (MICROZIDE) 12.5 MG capsule TAKE 1 CAPSULE BY MOUTH EVERY DAY 30 capsule 1   ketoconazole (NIZORAL) 2 % shampoo Apply 1 application. topically 2 (two) times a week. Wash scalp 2 times weekly, let sit 5 minutes and rinse out 120 mL 6   minoxidil (LONITEN) 2.5 MG tablet Take 1 tablet (2.5 mg total) by mouth daily. 90 tablet 1   modafinil (PROVIGIL) 100 MG tablet Take 50 mg by mouth 2 (two) times daily.     Tapinarof (VTAMA) 1 % CREA  Apply 1 application topically in the morning and at bedtime. Apply to affected area ears as needed for psoriasis. 60 g 2   omeprazole (PRILOSEC) 20 MG capsule TAKE 1 CAPSULE BY MOUTH EVERY DAY 90 capsule 2   predniSONE (STERAPRED UNI-PAK 21 TAB) 10 MG (21) TBPK tablet Take by mouth daily. As directed 21 tablet 0   tirzepatide (ZEPBOUND) 2.5 MG/0.5ML Pen Inject 2.5 mg into the skin once a week. 2 mL 0   No facility-administered medications prior to visit.    Allergies  Allergen Reactions   Elemental Sulfur Hives   Levaquin [Levofloxacin In D5w] Hives   Sulfa Antibiotics Hives    ROS Review of Systems  Constitutional: Negative.   HENT: Negative.    Respiratory:  Negative for chest tightness and shortness of breath.   Cardiovascular:  Negative for chest pain and leg  swelling.  Musculoskeletal: Negative.   Neurological: Negative.   Psychiatric/Behavioral: Negative.        Objective:    Physical Exam Constitutional:      Appearance: Normal appearance. She is obese.  HENT:     Head: Normocephalic.     Right Ear: Tympanic membrane normal.     Left Ear: Tympanic membrane normal.     Nose: Nose normal.     Mouth/Throat:     Mouth: Mucous membranes are moist.     Pharynx: Oropharynx is clear.  Eyes:     Extraocular Movements: Extraocular movements intact.     Conjunctiva/sclera: Conjunctivae normal.     Pupils: Pupils are equal, round, and reactive to light.  Cardiovascular:     Rate and Rhythm: Normal rate and regular rhythm.     Pulses: Normal pulses.     Heart sounds: Normal heart sounds.  Pulmonary:     Effort: Pulmonary effort is normal. No respiratory distress.     Breath sounds: Normal breath sounds. No rhonchi.  Abdominal:     General: Bowel sounds are normal.     Palpations: Abdomen is soft. There is no mass.     Tenderness: There is no abdominal tenderness.     Hernia: No hernia is present.  Musculoskeletal:        General: Normal range of motion.     Cervical back: Neck supple. No tenderness.  Skin:    General: Skin is warm.     Capillary Refill: Capillary refill takes less than 2 seconds.  Neurological:     General: No focal deficit present.     Mental Status: She is alert and oriented to person, place, and time. Mental status is at baseline.  Psychiatric:        Mood and Affect: Mood normal.        Behavior: Behavior normal.        Thought Content: Thought content normal.        Judgment: Judgment normal.     BP 122/76   Pulse 62   Temp 98.3 F (36.8 C)   Ht  (1.6 m)   Wt 178 lb 3.2 oz (80.8 kg)   SpO2 99%   BMI 31.57 kg/m  Wt Readings from Last 3 Encounters:  03/13/23 178 lb 3.2 oz (80.8 kg)  02/20/23 184 lb (83.5 kg)  01/03/23 185 lb 12.8 oz (84.3 kg)     Health Maintenance  Topic Date Due    COVID-19 Vaccine (3 - Pfizer risk series) 03/29/2023 (Originally 04/21/2020)   INFLUENZA VACCINE  06/26/2023   COLONOSCOPY (Pts 45-58yrs Insurance coverage  will need to be confirmed)  05/03/2032   Hepatitis C Screening  Completed   HIV Screening  Completed   HPV VACCINES  Aged Out   DTaP/Tdap/Td  Discontinued   PAP SMEAR-Modifier  Discontinued    There are no preventive care reminders to display for this patient.  Lab Results  Component Value Date   TSH 1.78 12/27/2022   Lab Results  Component Value Date   WBC 6.1 12/27/2022   HGB 13.7 12/27/2022   HCT 39.8 12/27/2022   MCV 92.2 12/27/2022   PLT 359.0 12/27/2022   Lab Results  Component Value Date   NA 141 12/27/2022   K 3.7 12/27/2022   CO2 30 12/27/2022   GLUCOSE 81 12/27/2022   BUN 8 12/27/2022   CREATININE 0.81 12/27/2022   BILITOT 0.5 12/27/2022   ALKPHOS 53 12/27/2022   AST 13 12/27/2022   ALT 12 12/27/2022   PROT 7.4 12/27/2022   ALBUMIN 4.1 12/27/2022   CALCIUM 8.9 12/27/2022   ANIONGAP 11 03/02/2018   EGFR 97 03/21/2022   GFR 87.13 12/27/2022   Lab Results  Component Value Date   CHOL 206 (H) 12/27/2022   Lab Results  Component Value Date   HDL 58.50 12/27/2022   Lab Results  Component Value Date   LDLCALC 123 (H) 12/27/2022   Lab Results  Component Value Date   TRIG 124.0 12/27/2022   Lab Results  Component Value Date   CHOLHDL 4 12/27/2022   No results found for: "HGBA1C"    Assessment & Plan:  Class 1 obesity with body mass index (BMI) of 32.0 to 32.9 in adult, unspecified obesity type, unspecified whether serious comorbidity present Assessment & Plan: Body mass index is 31.57 kg/m. Continue Zepbound weekly injection. Medication refilled.   Lymphadenopathy Assessment & Plan: Symptoms resolved.   Other orders -     Omeprazole; TAKE 1 CAPSULE BY MOUTH EVERY DAY  Dispense: 30 capsule; Refill: 1 -     Zepbound; Inject 5 mg into the skin once a week.  Dispense: 0.5 mL; Refill:  5    Follow-up: No follow-ups on file.   Kara Dies, NP

## 2023-03-14 ENCOUNTER — Ambulatory Visit: Payer: BC Managed Care – PPO | Admitting: Nurse Practitioner

## 2023-03-14 NOTE — Assessment & Plan Note (Signed)
Symptoms resolved 

## 2023-03-14 NOTE — Assessment & Plan Note (Signed)
Body mass index is 31.57 kg/m. Continue Zepbound weekly injection. Medication refilled.

## 2023-04-01 ENCOUNTER — Encounter: Payer: Self-pay | Admitting: Nurse Practitioner

## 2023-04-14 ENCOUNTER — Other Ambulatory Visit (INDEPENDENT_AMBULATORY_CARE_PROVIDER_SITE_OTHER): Payer: BC Managed Care – PPO

## 2023-04-14 DIAGNOSIS — I10 Essential (primary) hypertension: Secondary | ICD-10-CM

## 2023-04-14 LAB — BASIC METABOLIC PANEL
BUN: 7 mg/dL (ref 6–23)
CO2: 28 mEq/L (ref 19–32)
Calcium: 9 mg/dL (ref 8.4–10.5)
Chloride: 99 mEq/L (ref 96–112)
Creatinine, Ser: 0.77 mg/dL (ref 0.40–1.20)
GFR: 92.4 mL/min (ref >60.00)
Glucose, Bld: 93 mg/dL (ref 70–99)
Potassium: 3.4 mEq/L — ABNORMAL LOW (ref 3.5–5.1)
Sodium: 139 mEq/L (ref 135–145)

## 2023-04-18 ENCOUNTER — Encounter: Payer: Self-pay | Admitting: Nurse Practitioner

## 2023-04-18 ENCOUNTER — Ambulatory Visit (INDEPENDENT_AMBULATORY_CARE_PROVIDER_SITE_OTHER): Payer: BC Managed Care – PPO | Admitting: Nurse Practitioner

## 2023-04-18 VITALS — BP 122/74 | HR 63 | Temp 98.1°F | Ht 63.75 in | Wt 183.2 lb

## 2023-04-18 DIAGNOSIS — Z1231 Encounter for screening mammogram for malignant neoplasm of breast: Secondary | ICD-10-CM | POA: Diagnosis not present

## 2023-04-18 DIAGNOSIS — Z0000 Encounter for general adult medical examination without abnormal findings: Secondary | ICD-10-CM

## 2023-04-18 DIAGNOSIS — M79604 Pain in right leg: Secondary | ICD-10-CM | POA: Insufficient documentation

## 2023-04-18 DIAGNOSIS — E876 Hypokalemia: Secondary | ICD-10-CM | POA: Insufficient documentation

## 2023-04-18 DIAGNOSIS — F419 Anxiety disorder, unspecified: Secondary | ICD-10-CM | POA: Insufficient documentation

## 2023-04-18 DIAGNOSIS — N289 Disorder of kidney and ureter, unspecified: Secondary | ICD-10-CM | POA: Insufficient documentation

## 2023-04-18 DIAGNOSIS — M549 Dorsalgia, unspecified: Secondary | ICD-10-CM | POA: Insufficient documentation

## 2023-04-18 DIAGNOSIS — F32A Depression, unspecified: Secondary | ICD-10-CM | POA: Insufficient documentation

## 2023-04-18 DIAGNOSIS — T7840XA Allergy, unspecified, initial encounter: Secondary | ICD-10-CM | POA: Insufficient documentation

## 2023-04-18 MED ORDER — FINASTERIDE 5 MG PO TABS: 5.0000 mg | ORAL_TABLET | Freq: Every day | ORAL | 1 refills | Status: DC

## 2023-04-18 MED ORDER — ZEPBOUND 2.5 MG/0.5ML ~~LOC~~ SOAJ: 2.5000 mg | SUBCUTANEOUS | 5 refills | Status: AC

## 2023-04-18 MED ORDER — HYDROCHLOROTHIAZIDE 12.5 MG PO CAPS: 12.5000 mg | ORAL_CAPSULE | Freq: Every day | ORAL | 1 refills | Status: DC

## 2023-04-18 NOTE — Assessment & Plan Note (Signed)
Potassium 3.4 on 04/14/23 Advised her to increase intake of food containing potassium. Will repeat BMP in 1 month.

## 2023-04-18 NOTE — Assessment & Plan Note (Addendum)
Encouraged patient to consume a balanced diet and regular exercise regimen. Advised to see an eye doctor and dentist annually.  Mammogram ordered.

## 2023-04-18 NOTE — Patient Instructions (Addendum)
Referrral sent  for mammogram Consume food high in potassium. Hand out provided.

## 2023-04-18 NOTE — Progress Notes (Signed)
Established Patient Office Visit  Subjective:  Patient ID: Krista Ferguson, female    DOB: Nov 23, 1976  Age: 47 y.o. MRN: [id number]  CC:  Chief Complaint  Patient presents with   Annual Exam    Need referral for Mammo.    HPI  Krista Ferguson presents for Annual physical.  Flu: no  Tetanus: due COVID: Pfizer x 2 doses Pap smear: Partial hysterectomy had not had PAP in last 15 years  Dentist: May, 2024 Eye examination: due  Exercise: some  Diet: Patient does eat some meat. Patient consumes more fruits and veggies. Patient eat fried food rarely. Patient drinks mostly flavoring water, some coffee and tea.   She is overall doing good no concerns at present.    HPI   Past Medical History:  Diagnosis Date   Arthritis    Psosoratic   Atypical mole    lower abd, lower back ?   GERD (gastroesophageal reflux disease)     Past Surgical History:  Procedure Laterality Date   ABDOMINAL HYSTERECTOMY     Partial   CESAREAN SECTION  1995   CHOLECYSTECTOMY     COLONOSCOPY WITH PROPOFOL N/A 05/03/2022   Procedure: COLONOSCOPY WITH PROPOFOL;  Surgeon: Toney Reil, MD;  Location: [facility] ENDOSCOPY;  Service: Gastroenterology;  Laterality: N/A;   LAPAROSCOPIC GASTRIC SLEEVE RESECTION N/A 03/02/2018   Procedure: LAPAROSCOPIC GASTRIC SLEEVE RESECTION WITH HIATAL HERNIA REPAIR AND UPPER ENDOSCOPY;  Surgeon: Luretha Murphy, MD;  Location: [facility] ORS;  Service: General;  Laterality: N/A;   SINUSOTOMY  2011    Family History  Problem Relation Age of Onset   Hypertension Other    Diabetes Other    Stroke Other    Cancer Other    Asthma Other    COPD Other    Breast cancer Maternal Aunt    Breast cancer Maternal Grandmother     Social History   Socioeconomic History   Marital status: Divorced    Spouse name: Not on file   Number of children: Not on file   Years of education: Not on file   Highest education level: Not on file  Occupational History   Not  on file  Tobacco Use   Smoking status: Never   Smokeless tobacco: Never  Vaping Use   Vaping Use: Never used  Substance and Sexual Activity   Alcohol use: Yes    Comment: Rare-2 times a year   Drug use: Never   Sexual activity: Yes  Other Topics Concern   Not on file  Social History Narrative   Not on file   Social Determinants of Health   Financial Resource Strain: Not on file  Food Insecurity: Not on file  Transportation Needs: Not on file  Physical Activity: Not on file  Stress: Not on file  Social Connections: Not on file  Intimate Partner Violence: Not on file     Outpatient Medications Prior to Visit  Medication Sig Dispense Refill   Calcipotriene-Betameth Diprop 0.005-0.064 % FOAM Apply 1 application topically daily as needed (for psoriasis).      ketoconazole (NIZORAL) 2 % shampoo Apply 1 application. topically 2 (two) times a week. Wash scalp 2 times weekly, let sit 5 minutes and rinse out 120 mL 6   minoxidil (LONITEN) 2.5 MG tablet Take 1 tablet (2.5 mg total) by mouth daily. 90 tablet 1   modafinil (PROVIGIL) 100 MG tablet Take 50 mg by mouth 2 (two) times daily.     omeprazole (PRILOSEC)  20 MG capsule TAKE 1 CAPSULE BY MOUTH EVERY DAY 30 capsule 1   Tapinarof (VTAMA) 1 % CREA Apply 1 application topically in the morning and at bedtime. Apply to affected area ears as needed for psoriasis. 60 g 2   tirzepatide (ZEPBOUND) 5 MG/0.5ML Pen Inject 5 mg into the skin once a week. 0.5 mL 5   finasteride (PROSCAR) 5 MG tablet Take 1 tablet (5 mg total) by mouth daily. 90 tablet 1   hydrochlorothiazide (MICROZIDE) 12.5 MG capsule TAKE 1 CAPSULE BY MOUTH EVERY DAY 30 capsule 1   No facility-administered medications prior to visit.    Allergies  Allergen Reactions   Elemental Sulfur Hives   Levaquin [Levofloxacin In D5w] Hives   Sulfa Antibiotics Hives    ROS Review of Systems  All other systems reviewed and are negative.     Objective:    Physical  Exam Constitutional:      Appearance: Normal appearance. She is obese.  HENT:     Head: Normocephalic.     Right Ear: Tympanic membrane normal.     Left Ear: Tympanic membrane normal.     Nose: Nose normal.     Mouth/Throat:     Mouth: Mucous membranes are moist.     Pharynx: Oropharynx is clear.  Eyes:     Extraocular Movements: Extraocular movements intact.     Conjunctiva/sclera: Conjunctivae normal.     Pupils: Pupils are equal, round, and reactive to light.  Cardiovascular:     Rate and Rhythm: Normal rate and regular rhythm.     Pulses: Normal pulses.     Heart sounds: Normal heart sounds.  Pulmonary:     Effort: Pulmonary effort is normal. No respiratory distress.     Breath sounds: Normal breath sounds. No rhonchi.  Chest:  Breasts:    Right: No inverted nipple or mass.     Left: No inverted nipple or mass.     Comments: She has scar tissue on the left breast 3 o'clock Abdominal:     General: Bowel sounds are normal.     Palpations: Abdomen is soft. There is no mass.     Tenderness: There is no abdominal tenderness.     Hernia: No hernia is present.  Musculoskeletal:        General: Normal range of motion.     Cervical back: Neck supple. No tenderness.  Skin:    General: Skin is warm.     Capillary Refill: Capillary refill takes less than 2 seconds.  Neurological:     General: No focal deficit present.     Mental Status: She is alert and oriented to person, place, and time. Mental status is at baseline.  Psychiatric:        Mood and Affect: Mood normal.        Behavior: Behavior normal.        Thought Content: Thought content normal.        Judgment: Judgment normal.     BP 122/74   Pulse 63   Temp 98.1 F (36.7 C) (Temporal)   Ht 5' 3.75" (1.619 m)   Wt 183 lb 3.2 oz (83.1 kg)   SpO2 98%   BMI 31.69 kg/m  Wt Readings from Last 3 Encounters:  04/18/23 183 lb 3.2 oz (83.1 kg)  03/13/23 178 lb 3.2 oz (80.8 kg)  02/20/23 184 lb (83.5 kg)      Health Maintenance  Topic Date Due   INFLUENZA VACCINE  06/26/2023  Colonoscopy  05/03/2032   Hepatitis C Screening  Completed   HIV Screening  Completed   HPV VACCINES  Aged Out   DTaP/Tdap/Td  Discontinued   PAP SMEAR-Modifier  Discontinued   COVID-19 Vaccine  Discontinued    There are no preventive care reminders to display for this patient.  Lab Results  Component Value Date   TSH 1.78 12/27/2022   Lab Results  Component Value Date   WBC 6.1 12/27/2022   HGB 13.7 12/27/2022   HCT 39.8 12/27/2022   MCV 92.2 12/27/2022   PLT 359.0 12/27/2022   Lab Results  Component Value Date   NA 139 04/14/2023   K 3.4 (L) 04/14/2023   CO2 28 04/14/2023   GLUCOSE 93 04/14/2023   BUN 7 04/14/2023   CREATININE 0.77 04/14/2023   BILITOT 0.5 12/27/2022   ALKPHOS 53 12/27/2022   AST 13 12/27/2022   ALT 12 12/27/2022   PROT 7.4 12/27/2022   ALBUMIN 4.1 12/27/2022   CALCIUM 9.0 04/14/2023   ANIONGAP 11 03/02/2018   EGFR 97 03/21/2022   GFR 92.40 04/14/2023   Lab Results  Component Value Date   CHOL 206 (H) 12/27/2022   Lab Results  Component Value Date   HDL 58.50 12/27/2022   Lab Results  Component Value Date   LDLCALC 123 (H) 12/27/2022   Lab Results  Component Value Date   TRIG 124.0 12/27/2022   Lab Results  Component Value Date   CHOLHDL 4 12/27/2022   No results found for: "HGBA1C"    Assessment & Plan:  Health maintenance examination Assessment & Plan: Encouraged patient to consume a balanced diet and regular exercise regimen. Advised to see an eye doctor and dentist annually.  Mammogram ordered.    Encounter for screening mammogram for malignant neoplasm of breast -     3D Screening Mammogram, Left and Right; Future  Hypokalemia Assessment & Plan: Potassium 3.4 on 04/14/23 Advised her to increase intake of food containing potassium. Will repeat BMP in 1 month.  Orders: -     Basic metabolic panel  Other orders -     Finasteride;  Take 1 tablet (5 mg total) by mouth daily.  Dispense: 90 tablet; Refill: 1 -     hydroCHLOROthiazide; Take 1 capsule (12.5 mg total) by mouth daily.  Dispense: 90 capsule; Refill: 1    Follow-up: Return in about 6 months (around 10/19/2023) for chronic management.   Kara Dies, NP

## 2023-04-18 NOTE — Telephone Encounter (Signed)
Pt called regarding mychart message ?

## 2023-05-08 ENCOUNTER — Telehealth: Payer: Self-pay | Admitting: Nurse Practitioner

## 2023-05-08 DIAGNOSIS — E876 Hypokalemia: Secondary | ICD-10-CM

## 2023-05-08 NOTE — Telephone Encounter (Signed)
The labs are in. Thank you

## 2023-05-08 NOTE — Telephone Encounter (Signed)
Please place future orders for lab appt.  

## 2023-05-16 ENCOUNTER — Other Ambulatory Visit (INDEPENDENT_AMBULATORY_CARE_PROVIDER_SITE_OTHER): Payer: BC Managed Care – PPO

## 2023-05-16 DIAGNOSIS — E876 Hypokalemia: Secondary | ICD-10-CM

## 2023-05-16 LAB — BASIC METABOLIC PANEL
BUN: 7 mg/dL (ref 6–23)
CO2: 30 mEq/L (ref 19–32)
Calcium: 9.3 mg/dL (ref 8.4–10.5)
Chloride: 98 mEq/L (ref 96–112)
Creatinine, Ser: 0.88 mg/dL (ref 0.40–1.20)
GFR: 78.67 mL/min (ref >60.00)
Glucose, Bld: 82 mg/dL (ref 70–99)
Potassium: 3.4 mEq/L — ABNORMAL LOW (ref 3.5–5.1)
Sodium: 136 mEq/L (ref 135–145)

## 2023-05-22 ENCOUNTER — Ambulatory Visit
Admission: EM | Admit: 2023-05-22 | Discharge: 2023-05-22 | Disposition: A | Discharge status: Home / Self Care | Payer: BC Managed Care – PPO | Attending: Urgent Care | Admitting: Urgent Care

## 2023-05-22 ENCOUNTER — Ambulatory Visit
Admission: RE | Admit: 2023-05-22 | Discharge: 2023-05-22 | Disposition: A | Discharge status: Home / Self Care | Payer: BC Managed Care – PPO | Source: Ambulatory Visit | Attending: Nurse Practitioner | Admitting: Nurse Practitioner

## 2023-05-22 DIAGNOSIS — J019 Acute sinusitis, unspecified: Secondary | ICD-10-CM | POA: Diagnosis not present

## 2023-05-22 DIAGNOSIS — B9689 Other specified bacterial agents as the cause of diseases classified elsewhere: Secondary | ICD-10-CM

## 2023-05-22 DIAGNOSIS — Z1231 Encounter for screening mammogram for malignant neoplasm of breast: Secondary | ICD-10-CM | POA: Diagnosis present

## 2023-05-22 MED ORDER — CEFDINIR 300 MG PO CAPS
300.0000 mg | ORAL_CAPSULE | Freq: Two times a day (BID) | ORAL | 0 refills | Status: AC
Start: 2023-05-22 — End: 2023-05-29

## 2023-05-22 MED ORDER — PREDNISONE 20 MG PO TABS
ORAL_TABLET | ORAL | 0 refills | Status: AC
Start: 2023-05-22 — End: 2023-05-27

## 2023-05-22 NOTE — ED Provider Notes (Signed)
Krista Ferguson    CSN: [id number] Arrival date & time: 05/22/23  0803      History   Chief Complaint No chief complaint on file.   HPI Krista Ferguson is a 47 y.o. female.   HPI  Presents to UC with complaint of sinusitis x 2 weeks.  She endorses bilateral ear fullness, nasal congestion. Dizziness when bending over starting yesterday.  PMH includes recurrent maxillary and frontal sinusitis, kidney disease, anxiety. Recent BMP shows GFR > 60.  Past Medical History:  Diagnosis Date   Arthritis    Psosoratic   Atypical mole    lower abd, lower back ?   GERD (gastroesophageal reflux disease)     Patient Active Problem List   Diagnosis Date Noted   Allergy 04/18/2023   Anxiety 04/18/2023   Back pain 04/18/2023   Depression 04/18/2023   Kidney disease 04/18/2023   Leg pain, right 04/18/2023   Health maintenance examination 04/18/2023   Hypokalemia 04/18/2023   Lymphadenopathy 02/22/2023   Class 1 obesity with body mass index (BMI) of 32.0 to 32.9 in adult 01/19/2023   Lower extremity edema 12/13/2022   Encounter for screening colonoscopy 03/24/2022   Annual physical exam 03/19/2021   S/P panniculectomy 11/29/2020   Panniculitis 08/18/2020   Acute non-recurrent maxillary sinusitis 07/05/2020   Alopecia 07/05/2020   Acute recurrent maxillary sinusitis 07/05/2020   Candidiasis 07/05/2020   Yeast dermatitis 06/15/2020   Metabolic syndrome 06/15/2020   Psoriasis, unspecified 06/15/2020   Seasonal allergic rhinitis due to pollen 04/13/2020   Essential hypertension 04/13/2020   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   Vitamin D deficiency 01/02/2015   Subacute frontal sinusitis 12/03/2014    Past Surgical History:  Procedure Laterality Date   ABDOMINAL HYSTERECTOMY     Partial   CESAREAN SECTION  1995   CHOLECYSTECTOMY     COLONOSCOPY WITH PROPOFOL N/A 05/03/2022   Procedure: COLONOSCOPY  WITH PROPOFOL;  Surgeon: Toney Reil, MD;  Location[facility] ENDOSCOPY;  Service: Gastroenterology;  Laterality: N/A;   LAPAROSCOPIC GASTRIC SLEEVE RESECTION N/A 03/02/2018   Procedure: LAPAROSCOPIC GASTRIC SLEEVE RESECTION WITH HIATAL HERNIA REPAIR AND UPPER ENDOSCOPY;  Surgeon: Luretha Murphy, MD;  Location: [facility] ORS;  Service: General;  Laterality: N/A;   SINUSOTOMY  2011    OB History   No obstetric history on file.      Home Medications    Prior to Admission medications   Medication Sig Start Date End Date Taking? Authorizing Provider  Calcipotriene-Betameth Diprop 0.005-0.064 % FOAM Apply 1 application topically daily as needed (for psoriasis).     [provider]  finasteride (PROSCAR) 5 MG tablet Take 1 tablet (5 mg total) by mouth daily. 04/18/23   Kara Dies, NP  hydrochlorothiazide (MICROZIDE) 12.5 MG capsule Take 1 capsule (12.5 mg total) by mouth daily. 04/18/23   Kara Dies, NP  ketoconazole (NIZORAL) 2 % shampoo Apply 1 application. topically 2 (two) times a week. Wash scalp 2 times weekly, let sit 5 minutes and rinse out 03/18/22   Willeen Niece, MD  minoxidil (LONITEN) 2.5 MG tablet Take 1 tablet (2.5 mg total) by mouth daily. 01/21/23   Willeen Niece, MD  modafinil (PROVIGIL) 100 MG tablet Take 50 mg by mouth 2 (two) times daily. 02/22/22   [provider]  omeprazole (PRILOSEC) 20 MG capsule TAKE 1 CAPSULE BY MOUTH EVERY DAY 03/13/23   Kara Dies, NP  Tapinarof (VTAMA) 1 % CREA  Apply 1 application topically in the morning and at bedtime. Apply to affected area ears as needed for psoriasis. 12/24/21   Willeen Niece, MD  tirzepatide [facility]) 2.5 MG/0.5ML Pen Inject 2.5 mg into the skin once a week. 04/18/23   Kara Dies, NP    Family History Family History  Problem Relation Age of Onset   Hypertension Other    Diabetes Other    Stroke Other    Cancer Other    Asthma Other    COPD Other    Breast cancer Maternal Aunt     Breast cancer Maternal Grandmother     Social History Social History   Tobacco Use   Smoking status: Never   Smokeless tobacco: Never  Vaping Use   Vaping Use: Never used  Substance Use Topics   Alcohol use: Yes    Comment: Rare-2 times a year   Drug use: Never     Allergies   Elemental sulfur, Levaquin [levofloxacin in d5w], and Sulfa antibiotics   Review of Systems Review of Systems   Physical Exam Triage Vital Signs ED Triage Vitals  Enc Vitals Group     BP      Pulse      Resp      Temp      Temp src      SpO2      Weight      Height      Head Circumference      Peak Flow      Pain Score      Pain Loc      Pain Edu?      Excl. in GC?    No data found.  Updated Vital Signs There were no vitals taken for this visit.  Visual Acuity Right Eye Distance:   Left Eye Distance:   Bilateral Distance:    Right Eye Near:   Left Eye Near:    Bilateral Near:     Physical Exam Vitals reviewed.  Constitutional:      Appearance: She is ill-appearing.  HENT:     Right Ear: A middle ear effusion is present.     Left Ear: A middle ear effusion is present.     Nose: Congestion present.     Right Sinus: Maxillary sinus tenderness present.     Mouth/Throat:     Mouth: Mucous membranes are moist.     Pharynx: No oropharyngeal exudate or posterior oropharyngeal erythema.  Cardiovascular:     Rate and Rhythm: Normal rate and regular rhythm.     Pulses: Normal pulses.     Heart sounds: Normal heart sounds.  Pulmonary:     Effort: Pulmonary effort is normal.     Breath sounds: Normal breath sounds.  Musculoskeletal:        General: Normal range of motion.  Skin:    General: Skin is warm and dry.  Neurological:     General: No focal deficit present.     Mental Status: She is alert and oriented to person, place, and time.  Psychiatric:        Mood and Affect: Mood normal.        Behavior: Behavior normal.      UC Treatments / Results  Labs (all labs  ordered are listed, but only abnormal results are displayed) Labs Reviewed - No data to display  EKG   Radiology No results found.  Procedures Procedures (including critical care time)  Medications Ordered in UC Medications -  No data to display  Initial Impression / Assessment and Plan / UC Course  I have reviewed the triage vital signs and the nursing notes.  Pertinent labs & imaging results that were available during my care of the patient were reviewed by me and considered in my medical decision making (see chart for details).   Krista Ferguson is a 47 y.o. female presenting with sinusitis. Patient is afebrile without recent antipyretics, satting well on room air. Overall is ill appearing though non-toxic, well hydrated, without respiratory distress. Pulmonary exam is unremarkable.  Lungs CTAB without wheezing, rhonchi, rales. RRR without murmurs, rubs, gallops.  No pharyngeal erythema or peritonsillar exudate.  There is right maxillary sinus tenderness with palpation.  Reviewed relevant chart history.   Given duration of symptoms and history of recurrent sinusitis, likely acute bacterial.  Will treat with antibiotic therapy as well as prednisone taper.  She endorses intolerance of Augmentin but states she can tolerate cefdinir.  Review of her chart indicates she has tolerated before.  Reviewed typical side effects of prednisone.  Counseled patient on potential for adverse effects with medications prescribed/recommended today, ER and return-to-clinic precautions discussed, patient verbalized understanding and agreement with care plan.  Final Clinical Impressions(s) / UC Diagnoses   Final diagnoses:  None   Discharge Instructions   None    ED Prescriptions   None    PDMP not reviewed this encounter.   Charma Igo, [place] 05/22/23 [phone]

## 2023-05-22 NOTE — Discharge Instructions (Addendum)
Follow up here or with your primary care provider if your symptoms are worsening or not improving with treatment. Recommend evaluation by ENT if you have recurrent symptoms.

## 2023-05-22 NOTE — ED Triage Notes (Signed)
Patient presents to UC for sinus infection x 2 weeks. Ear fullness and nasal congestion x 2 weeks, dizziness when bending over since yesterday. Taking OTC sinus meds.

## 2023-06-10 ENCOUNTER — Telehealth: Payer: Self-pay | Admitting: Pharmacy Technician

## 2023-06-10 NOTE — Telephone Encounter (Signed)
Questions answered.  PA submitted. 

## 2023-06-10 NOTE — Telephone Encounter (Signed)
Pharmacy Patient Advocate Encounter   Received notification from CoverMyMeds that prior authorization for Zepbound 5MG /0.5ML pen-injectors is required/requested.   Insurance verification completed.   The patient is insured through [facility] .   Per test claim: PA started via CoverMyMeds. KEY [id number] . Waiting for clinical questions to populate.

## 2023-06-12 ENCOUNTER — Encounter: Payer: Self-pay | Admitting: *Deleted

## 2023-06-12 NOTE — Telephone Encounter (Signed)
Received fax for additional information for continuation of coverage. Please advise on patient's current weight. Last weight check was in May - only showing a couple of pounds lost since starting Zepbound.

## 2023-06-12 NOTE — Telephone Encounter (Signed)
Sent mychart message requesting current weight. This may require a nurse visit for a weight check

## 2023-06-18 NOTE — Telephone Encounter (Signed)
Per patients response:\   I was just at Urgent Care on 6/27 so I am not sure why they did not document my weight/BP during that visit for a sinus infection. I also went to [facility] on 7/17 and I am not sure why they also didn't document my weight. I just got on the scales and my current weight is 168.

## 2023-06-19 NOTE — Telephone Encounter (Signed)
Noted. Answered questions and faxed back to [facility], currently pending determination

## 2023-06-24 ENCOUNTER — Other Ambulatory Visit (HOSPITAL_COMMUNITY): Payer: Self-pay

## 2023-06-24 NOTE — Telephone Encounter (Signed)
Pharmacy Patient Advocate Encounter  Received notification from [facility] that Prior Authorization for {Zepbound 5MG /0.5ML pen-injectors  has been APPROVED from 06/10/23 to 06/09/24. Ran test claim, Copay is $24.99  PA #/Case ID/Reference #: [id number]

## 2023-06-24 NOTE — Telephone Encounter (Signed)
Noted  

## 2023-06-24 NOTE — Telephone Encounter (Signed)
Called Patient to let her know the Zepbound has been approved.

## 2023-06-30 ENCOUNTER — Ambulatory Visit: Payer: BC Managed Care – PPO | Admitting: Dermatology

## 2023-06-30 ENCOUNTER — Encounter: Payer: Self-pay | Admitting: Dermatology

## 2023-06-30 VITALS — BP 93/63 | HR 60

## 2023-06-30 DIAGNOSIS — D2271 Melanocytic nevi of right lower limb, including hip: Secondary | ICD-10-CM

## 2023-06-30 DIAGNOSIS — Z1283 Encounter for screening for malignant neoplasm of skin: Secondary | ICD-10-CM | POA: Diagnosis not present

## 2023-06-30 DIAGNOSIS — Z79899 Other long term (current) drug therapy: Secondary | ICD-10-CM

## 2023-06-30 DIAGNOSIS — L739 Follicular disorder, unspecified: Secondary | ICD-10-CM

## 2023-06-30 DIAGNOSIS — L409 Psoriasis, unspecified: Secondary | ICD-10-CM

## 2023-06-30 DIAGNOSIS — L578 Other skin changes due to chronic exposure to nonionizing radiation: Secondary | ICD-10-CM | POA: Diagnosis not present

## 2023-06-30 DIAGNOSIS — L649 Androgenic alopecia, unspecified: Secondary | ICD-10-CM | POA: Diagnosis not present

## 2023-06-30 DIAGNOSIS — L814 Other melanin hyperpigmentation: Secondary | ICD-10-CM

## 2023-06-30 DIAGNOSIS — D225 Melanocytic nevi of trunk: Secondary | ICD-10-CM

## 2023-06-30 DIAGNOSIS — D229 Melanocytic nevi, unspecified: Secondary | ICD-10-CM

## 2023-06-30 DIAGNOSIS — D2272 Melanocytic nevi of left lower limb, including hip: Secondary | ICD-10-CM

## 2023-06-30 DIAGNOSIS — L709 Acne, unspecified: Secondary | ICD-10-CM

## 2023-06-30 DIAGNOSIS — L821 Other seborrheic keratosis: Secondary | ICD-10-CM

## 2023-06-30 DIAGNOSIS — W908XXA Exposure to other nonionizing radiation, initial encounter: Secondary | ICD-10-CM

## 2023-06-30 MED ORDER — CLINDAMYCIN PHOSPHATE 1 % EX LOTN: TOPICAL_LOTION | CUTANEOUS | 6 refills | Status: DC

## 2023-06-30 MED ORDER — FINASTERIDE 5 MG PO TABS: 5.0000 mg | ORAL_TABLET | Freq: Every day | ORAL | 3 refills | Status: DC

## 2023-06-30 MED ORDER — VTAMA 1 % EX CREA
1.0000 | TOPICAL_CREAM | Freq: Two times a day (BID) | CUTANEOUS | 5 refills | Status: DC
Start: 2023-06-30 — End: 2024-07-19

## 2023-06-30 MED ORDER — KETOCONAZOLE 2 % EX SHAM
1.0000 | MEDICATED_SHAMPOO | CUTANEOUS | 6 refills | Status: DC
Start: 2023-06-30 — End: 2024-07-19

## 2023-06-30 MED ORDER — MINOXIDIL 2.5 MG PO TABS
2.5000 mg | ORAL_TABLET | Freq: Every day | ORAL | 3 refills | Status: DC
Start: 2023-06-30 — End: 2024-07-19

## 2023-06-30 NOTE — Progress Notes (Signed)
Follow-Up Visit   Subjective  Krista Ferguson is a 47 y.o. female who presents for the following: Skin Cancer Screening and Full Body Skin Exam Pt here for psoriasis which is well controlled using Ketoconazole shampoo BIW and Vtama cream as needed.  Pt following up on alopecia. She currently takes finasteride daily and uses minoxidil topically and feels she is doing well. No side effects  The patient presents for Total-Body Skin Exam (TBSE) for skin cancer screening and mole check. The patient has spots, moles and lesions to be evaluated, some may be new or changing and the patient may have concern these could be cancer.  Pt has had spots removed that were precancer but no hx of skin cancer. No family hx of skin cancer  The following portions of the chart were reviewed this encounter and updated as appropriate: medications, allergies, medical history  Review of Systems:  No other skin or systemic complaints except as noted in HPI or Assessment and Plan.  Objective  Well appearing patient in no apparent distress; mood and affect are within normal limits.  A full examination was performed including scalp, head, eyes, ears, nose, lips, neck, chest, axillae, abdomen, back, buttocks, bilateral upper extremities, bilateral lower extremities, hands, feet, fingers, toes, fingernails, and toenails. All findings within normal limits unless otherwise noted below.   Relevant physical exam findings are noted in the Assessment and Plan.     Assessment & Plan   SKIN CANCER SCREENING PERFORMED TODAY.  ACTINIC DAMAGE - Chronic condition, secondary to cumulative UV/sun exposure - diffuse scaly erythematous macules with underlying dyspigmentation - Recommend daily broad spectrum sunscreen SPF 30+ to sun-exposed areas, reapply every 2 hours as needed.  - Staying in the shade or wearing long sleeves, sun glasses (UVA+UVB protection) and wide brim hats (4-inch brim around the entire  circumference of the hat) are also recommended for sun protection.  - Call for new or changing lesions.  LENTIGINES, SEBORRHEIC KERATOSES, HEMANGIOMAS - Benign normal skin lesions - Benign-appearing - Call for any changes - SK right upper forehead  MELANOCYTIC NEVI - Tan-brown and/or pink-flesh-colored symmetric macules and papules - Benign appearing on exam today - Observation - Call clinic for new or changing moles - Recommend daily use of broad spectrum spf 30+ sunscreen to sun-exposed areas.  NEVI trunk, extremities Right 2nd Plantar Toe: 4.61mm brown macule   Left upper pretibia: 3.60mm brown macule slight irreg border    Right Upper Anterior Thigh: 4.26mm medium dark brown papule  Right medial foot 6.00 mm speckled tan papule   L flank: 8.32mm brown papule   Left mid pretibia 4.0 mm medium dark brown macule  Benign-appearing.  Observation.  Call clinic for new or changing moles.  Recommend daily use of broad spectrum spf 30+ sunscreen to sun-exposed areas.    PSORIASIS Exam: Mild erythema and scale at ears. Scalp clear.  Chronic condition with duration or expected duration over one year. Currently well-controlled.   patient has joint pain on occasion, usually in the winter  Psoriasis is a chronic non-curable, but treatable genetic/hereditary disease that may have other systemic features affecting other organ systems such as joints (Psoriatic Arthritis). It is associated with an increased risk of inflammatory bowel disease, heart disease, non-alcoholic fatty liver disease, and depression.  Treatments include light and laser treatments; topical medications; and systemic medications including oral and injectables.  Treatment Plan: Continue Vtama 1 % cream apply topically to aa ears daily  Continue Ketoconazole 2 % shampoo -  apply topically 2 times a week. Wash scalp 2 times weekly let sit 5 minutes and rinse out.     ANDROGENETIC ALOPECIA (FEMALE PATTERN HAIR  LOSS) Exam: Diffuse thinning of the crown and widening of the midline part with retention of the frontal hairline, Photos compared with 11/2021 and more hair growth noted.  Chronic condition with duration or expected duration over one year. Currently well-controlled.   Female Androgenic Alopecia is a chronic condition related to genetics and/or hormonal changes.  In women androgenetic alopecia is commonly associated with menopause but may occur any time after puberty.  It causes hair thinning primarily on the crown with widening of the part and temporal hairline recession.  Can use OTC Rogaine (minoxidil) 5% solution/foam as directed.  Oral treatments in female patients who have no contraindication may include : - Low dose oral minoxidil 1.25 - 5mg  daily - Spironolactone 50 - 100mg  bid - Finasteride 2.5 - 5 mg daily Adjunctive therapies include: - Low Level Laser Light Therapy (LLLT) - Platelet-rich plasma injections (PRP) - Hair Transplants or scalp reduction   Treatment Plan: Continue Minoxidil 2.5mg ,1 po qd Cont Finasteride 5mg  1 po qd  Long term medication management.  Patient is using long term (months to years) prescription medication  to control their dermatologic condition.  These medications require periodic monitoring to evaluate for efficacy and side effects and may require periodic laboratory monitoring.    FOLLICULITIS/Acne Exam: follicular pink papules on right buttocks, nose   Treatment Plan: -Clindamycin lotion to affected area bid prn   Recommend OTC benzoyl peroxide cleanser, wash affected areas daily in shower, let sit several minutes prior to rinsing.  May bleach towels if not rinsed off completely.  Recommended brands include Panoxyl 4% Creamy Wash, CeraVe Acne Foaming Cream wash, or Cetaphil Gentle Clear Complexion-Clearing BPO Acne Cleanser.    Atypical mole  Psoriasis  Related Medications ketoconazole (NIZORAL) 2 % shampoo Apply 1 Application topically 2  (two) times a week. Wash scalp 2 times weekly, let sit 5 minutes and rinse out  Tapinarof (VTAMA) 1 % CREA Apply 1 application  topically in the morning and at bedtime. Apply to affected area ears as needed for psoriasis.  Androgenetic alopecia  Related Medications minoxidil (LONITEN) 2.5 MG tablet Take 1 tablet (2.5 mg total) by mouth daily.     Return in about 1 year (around 06/29/2024) for TBSE.  I, Tillie Fantasia, CMA, am acting as scribe for Willeen Niece, MD.   Documentation: I have reviewed the above documentation for accuracy and completeness, and I agree with the above.  Willeen Niece, MD

## 2023-06-30 NOTE — Patient Instructions (Addendum)
FOLLICULITIS Treatment Plan: -Clindamycin lotion to affected area twice a day as needed  Recommend OTC benzoyl peroxide cleanser, wash affected areas daily in shower, let sit several minutes prior to rinsing.  May bleach towels if not rinsed off completely.  Recommended brands include Panoxyl 4% Creamy Wash, CeraVe Acne Foaming Cream wash, or Cetaphil Gentle Clear Complexion-Clearing BPO Acne Cleanser.   Alopecia Treatment Plan: Continue Minoxidil 2.5mg ,1 po qd Cont Finasteride 5mg  1 po every day   Psoriasis  Treatment Plan: Continue Vtama 1 % cream apply topically to aa daily  Continue Ketoconazole 2 % shampoo - apply topically 2 times a week. Wash scalp 2 times weekly let sit 5 minutes and rinse out.     Due to recent changes in healthcare laws, you may see results of your pathology and/or laboratory studies on MyChart before the doctors have had a chance to review them. We understand that in some cases there may be results that are confusing or concerning to you. Please understand that not all results are received at the same time and often the doctors may need to interpret multiple results in order to provide you with the best plan of care or course of treatment. Therefore, we ask that you please give [place] 2 business days to thoroughly review all your results before contacting the office for clarification. Should we see a critical lab result, you will be contacted sooner.   If You Need Anything After Your Visit  If you have any questions or concerns for your doctor, please call our main line at [phone] If no one answers, please leave a voicemail as directed and we will return your call as soon as possible. Messages left after 4 pm will be answered the following business day.   You may also send [place] a message via MyChart. We typically respond to MyChart messages within 1-2 business days.  For prescription refills, please ask your pharmacy to contact our office. Our fax number is  [phone].  If you have an urgent issue when the clinic is closed that cannot wait until the next business day, you can page your doctor at the number below.    Please note that while we do our best to be available for urgent issues outside of office hours, we are not available 24/7.   If you have an urgent issue and are unable to reach [place], you may choose to seek medical care at your doctor's office, retail clinic, urgent care center, or emergency room.  If you have a medical emergency, please immediately call 911 or go to the emergency department. In the event of inclement weather, please call our main line at [phone] for an update on the status of any delays or closures.  Dermatology Medication Tips: Please keep the boxes that topical medications come in in order to help keep track of the instructions about where and how to use these. Pharmacies typically print the medication instructions only on the boxes and not directly on the medication tubes.   If your medication is too expensive, please contact our office at [phone] or send [place] a message through MyChart.   We are unable to tell what your co-pay for medications will be in advance as this is different depending on your insurance coverage. However, we may be able to find a substitute medication at lower cost or fill out paperwork to get insurance to cover a needed medication.   If a prior authorization is required to get your medication covered by  your insurance company, please allow [place] 1-2 business days to complete this process.  Drug prices often vary depending on where the prescription is filled and some pharmacies may offer cheaper prices.  The website www.goodrx.com contains coupons for medications through different pharmacies. The prices here do not account for what the cost may be with help from insurance (it may be cheaper with your insurance), but the website can give you the price if you did not use any insurance.  -  You can print the associated coupon and take it with your prescription to the pharmacy.  - You may also stop by our office during regular business hours and pick up a GoodRx coupon card.  - If you need your prescription sent electronically to a different pharmacy, notify our office through [facility] or by phone at [phone]

## 2023-09-18 ENCOUNTER — Other Ambulatory Visit: Payer: Self-pay | Admitting: Nurse Practitioner

## 2023-09-25 ENCOUNTER — Encounter (HOSPITAL_COMMUNITY): Payer: Self-pay | Admitting: *Deleted

## 2023-10-17 ENCOUNTER — Ambulatory Visit: Payer: BC Managed Care – PPO | Admitting: Nurse Practitioner

## 2023-10-31 ENCOUNTER — Ambulatory Visit: Payer: BC Managed Care – PPO | Admitting: Nurse Practitioner

## 2023-10-31 ENCOUNTER — Encounter: Payer: Self-pay | Admitting: Nurse Practitioner

## 2023-10-31 VITALS — BP 120/80 | HR 85 | Temp 98.4°F | Ht 63.75 in | Wt 143.4 lb

## 2023-10-31 DIAGNOSIS — E785 Hyperlipidemia, unspecified: Secondary | ICD-10-CM

## 2023-10-31 DIAGNOSIS — K219 Gastro-esophageal reflux disease without esophagitis: Secondary | ICD-10-CM | POA: Diagnosis not present

## 2023-10-31 DIAGNOSIS — I10 Essential (primary) hypertension: Secondary | ICD-10-CM

## 2023-10-31 DIAGNOSIS — E559 Vitamin D deficiency, unspecified: Secondary | ICD-10-CM | POA: Diagnosis not present

## 2023-10-31 LAB — LIPID PANEL
Cholesterol: 174 mg/dL (ref 0–200)
HDL: 47.5 mg/dL (ref >39.00)
LDL Cholesterol: 111 mg/dL — ABNORMAL HIGH (ref 0–99)
NonHDL: 126.58
Total CHOL/HDL Ratio: 4
Triglycerides: 80 mg/dL (ref 0.0–149.0)
VLDL: 16 mg/dL (ref 0.0–40.0)

## 2023-10-31 LAB — CBC WITH DIFFERENTIAL/PLATELET
Basophils Absolute: 0.1 10*3/uL (ref 0.0–0.1)
Basophils Relative: 0.8 % (ref 0.0–3.0)
Eosinophils Absolute: 0 10*3/uL (ref 0.0–0.7)
Eosinophils Relative: 0.5 % (ref 0.0–5.0)
HCT: 41.1 % (ref 36.0–46.0)
Hemoglobin: 14.7 g/dL (ref 12.0–15.0)
Lymphocytes Relative: 29.9 % (ref 12.0–46.0)
Lymphs Abs: 2.4 10*3/uL (ref 0.7–4.0)
MCHC: 35.7 g/dL (ref 30.0–36.0)
MCV: 94.1 fL (ref 78.0–100.0)
Monocytes Absolute: 0.6 10*3/uL (ref 0.1–1.0)
Monocytes Relative: 7.5 % (ref 3.0–12.0)
Neutro Abs: 5 10*3/uL (ref 1.4–7.7)
Neutrophils Relative %: 61.3 % (ref 43.0–77.0)
Platelets: 408 10*3/uL — ABNORMAL HIGH (ref 150.0–400.0)
RBC: 4.37 Mil/uL (ref 3.87–5.11)
RDW: 13.3 % (ref 11.5–15.5)
WBC: 8.1 10*3/uL (ref 4.0–10.5)

## 2023-10-31 LAB — COMPREHENSIVE METABOLIC PANEL
ALT: 7 U/L (ref 0–35)
AST: 10 U/L (ref 0–37)
Albumin: 4.3 g/dL (ref 3.5–5.2)
Alkaline Phosphatase: 53 U/L (ref 39–117)
BUN: 10 mg/dL (ref 6–23)
CO2: 32 meq/L (ref 19–32)
Calcium: 9.1 mg/dL (ref 8.4–10.5)
Chloride: 99 meq/L (ref 96–112)
Creatinine, Ser: 0.75 mg/dL (ref 0.40–1.20)
GFR: 94.99 mL/min (ref >60.00)
Glucose, Bld: 79 mg/dL (ref 70–99)
Potassium: 3.4 meq/L — ABNORMAL LOW (ref 3.5–5.1)
Sodium: 139 meq/L (ref 135–145)
Total Bilirubin: 1 mg/dL (ref 0.2–1.2)
Total Protein: 7.1 g/dL (ref 6.0–8.3)

## 2023-10-31 LAB — VITAMIN D 25 HYDROXY (VIT D DEFICIENCY, FRACTURES): VITD: 22.92 ng/mL — ABNORMAL LOW (ref 30.00–100.00)

## 2023-10-31 LAB — B12 AND FOLATE PANEL
Folate: 4.8 ng/mL — ABNORMAL LOW (ref >5.9)
Vitamin B-12: 189 pg/mL — ABNORMAL LOW (ref 211–911)

## 2023-10-31 LAB — TSH: TSH: 1 u[IU]/mL (ref 0.35–5.50)

## 2023-10-31 NOTE — Progress Notes (Signed)
Established Patient Office Visit  Subjective:  Patient ID: Krista Ferguson, female    DOB: [birth year]  Age: 47 y.o. MRN: [id number]  CC:  Chief Complaint  Patient presents with   Medical Management of Chronic Issues    6 Month F/up on blood pressure, cholesterol and weight management     HPI  Krista Ferguson presents for routine follow up. She has lost 40 lb in 6 month on zepbound weekly injection.  Denies any swelling.  She is overall doing well. Has stopped taking modafinil.  Due for blood work.   HPI   Past Medical History:  Diagnosis Date   Arthritis    Psosoratic   Atypical mole    lower abd, lower back ?   GERD (gastroesophageal reflux disease)     Past Surgical History:  Procedure Laterality Date   ABDOMINAL HYSTERECTOMY     Partial   CESAREAN SECTION  1995   CHOLECYSTECTOMY     COLONOSCOPY WITH PROPOFOL N/A 05/03/2022   Procedure: COLONOSCOPY WITH PROPOFOL;  Surgeon: Toney Reil, MD;  [facility] ENDOSCOPY;  Service: Gastroenterology;  Laterality: N/A;   LAPAROSCOPIC GASTRIC SLEEVE RESECTION N/A 03/02/2018   Procedure: LAPAROSCOPIC GASTRIC SLEEVE RESECTION WITH HIATAL HERNIA REPAIR AND UPPER ENDOSCOPY;  Surgeon: Luretha Murphy, MD;  Location: [facility] ORS;  Service: General;  Laterality: N/A;   SINUSOTOMY  2011    Family History  Problem Relation Age of Onset   Hypertension Other    Diabetes Other    Stroke Other    Cancer Other    Asthma Other    COPD Other    Breast cancer Maternal Aunt    Breast cancer Maternal Grandmother     Social History   Socioeconomic History   Marital status: Divorced    Spouse name: Not on file   Number of children: Not on file   Years of education: Not on file   Highest education level: Not on file  Occupational History   Not on file  Tobacco Use   Smoking status: Never   Smokeless tobacco: Never  Vaping Use   Vaping status: Never Used  Substance and Sexual Activity   Alcohol use: Yes     Comment: Rare-2 times a year   Drug use: Never   Sexual activity: Yes  Other Topics Concern   Not on file  Social History Narrative   Not on file   Social Drivers of Health   Financial Resource Strain: Not on file  Food Insecurity: Not on file  Transportation Needs: Not on file  Physical Activity: Not on file  Stress: Not on file  Social Connections: Not on file  Intimate Partner Violence: Not on file     Outpatient Medications Prior to Visit  Medication Sig Dispense Refill   Calcipotriene-Betameth Diprop 0.005-0.064 % FOAM Apply 1 application topically daily as needed (for psoriasis).      clindamycin (CLEOCIN-T) 1 % lotion Apply to affected area once to twice daily 60 mL 6   finasteride (PROSCAR) 5 MG tablet Take 1 tablet (5 mg total) by mouth daily. 90 tablet 3   hydrochlorothiazide (MICROZIDE) 12.5 MG capsule TAKE ONE CAPSULE BY MOUTH ONE TIME DAILY 90 capsule 1   ketoconazole (NIZORAL) 2 % shampoo Apply 1 Application topically 2 (two) times a week. Wash scalp 2 times weekly, let sit 5 minutes and rinse out 120 mL 6   minoxidil (LONITEN) 2.5 MG tablet Take 1 tablet (2.5 mg total) by mouth daily.  90 tablet 3   omeprazole (PRILOSEC) 20 MG capsule TAKE 1 CAPSULE BY MOUTH EVERY DAY 30 capsule 1   Tapinarof (VTAMA) 1 % CREA Apply 1 application  topically in the morning and at bedtime. Apply to affected area ears as needed for psoriasis. 60 g 5   ZEPBOUND 5 MG/0.5ML Pen Inject 5 mg into the skin once a week.     modafinil (PROVIGIL) 100 MG tablet Take 50 mg by mouth 2 (two) times daily. (Patient not taking: Reported on 10/31/2023)     tirzepatide (ZEPBOUND) 2.5 MG/0.5ML Pen Inject 2.5 mg into the skin once a week. (Patient not taking: Reported on 10/31/2023) 0.5 mL 5   No facility-administered medications prior to visit.    Allergies  Allergen Reactions   Elemental Sulfur Hives   Levaquin [Levofloxacin In D5w] Hives   Sulfa Antibiotics Hives   Augmentin [Amoxicillin-Pot  Clavulanate] Rash    Patient endorses full body rash which starts with her legs and progresses cephalic    ROS Review of Systems Negative unless indicated in HPI.    Objective:    Physical Exam Constitutional:      Appearance: Normal appearance.  HENT:     Mouth/Throat:     Mouth: Mucous membranes are moist.  Eyes:     Conjunctiva/sclera: Conjunctivae normal.     Pupils: Pupils are equal, round, and reactive to light.  Cardiovascular:     Rate and Rhythm: Normal rate and regular rhythm.     Pulses: Normal pulses.     Heart sounds: Normal heart sounds.  Pulmonary:     Effort: Pulmonary effort is normal.     Breath sounds: Normal breath sounds.  Abdominal:     General: Bowel sounds are normal.     Palpations: Abdomen is soft.  Musculoskeletal:     Cervical back: Normal range of motion. No tenderness.  Skin:    General: Skin is warm.     Findings: No bruising.  Neurological:     General: No focal deficit present.     Mental Status: She is alert and oriented to person, place, and time. Mental status is at baseline.  Psychiatric:        Mood and Affect: Mood normal.        Behavior: Behavior normal.        Thought Content: Thought content normal.        Judgment: Judgment normal.     BP 120/80   Pulse 85   Temp 98.4 F (36.9 C) (Oral)   Ht 5' 3.75" (1.619 m)   Wt 143 lb 6.4 oz (65 kg)   SpO2 98%   BMI 24.81 kg/m  Wt Readings from Last 3 Encounters:  10/31/23 143 lb 6.4 oz (65 kg)  04/18/23 183 lb 3.2 oz (83.1 kg)  03/13/23 178 lb 3.2 oz (80.8 kg)     Health Maintenance  Topic Date Due   Cervical Cancer Screening (HPV/Pap Cotest)  Never done   INFLUENZA VACCINE  02/23/2024 (Originally 06/26/2023)   Colonoscopy  05/03/2032   Hepatitis C Screening  Completed   HIV Screening  Completed   HPV VACCINES  Aged Out   DTaP/Tdap/Td  Discontinued   COVID-19 Vaccine  Discontinued    There are no preventive care reminders to display for this patient.  Lab  Results  Component Value Date   TSH 1.00 10/31/2023   Lab Results  Component Value Date   WBC 8.1 10/31/2023   HGB 14.7 10/31/2023  HCT 41.1 10/31/2023   MCV 94.1 10/31/2023   PLT 408.0 (H) 10/31/2023   Lab Results  Component Value Date   NA 139 10/31/2023   K 3.4 (L) 10/31/2023   CO2 32 10/31/2023   GLUCOSE 79 10/31/2023   BUN 10 10/31/2023   CREATININE 0.75 10/31/2023   BILITOT 1.0 10/31/2023   ALKPHOS 53 10/31/2023   AST 10 10/31/2023   ALT 7 10/31/2023   PROT 7.1 10/31/2023   ALBUMIN 4.3 10/31/2023   CALCIUM 9.1 10/31/2023   ANIONGAP 11 03/02/2018   EGFR 97 03/21/2022   GFR 94.99 10/31/2023   Lab Results  Component Value Date   CHOL 174 10/31/2023   Lab Results  Component Value Date   HDL 47.50 10/31/2023   Lab Results  Component Value Date   LDLCALC 111 (H) 10/31/2023   Lab Results  Component Value Date   TRIG 80.0 10/31/2023   Lab Results  Component Value Date   CHOLHDL 4 10/31/2023   No results found for: "HGBA1C"    Assessment & Plan:  Essential hypertension Assessment & Plan: Patient BP  Vitals:   10/31/23 0759  BP: 120/80    in the office. Advised pt to follow a low sodium and heart healthy diet. Continue HCTZ daily.  Orders: -     CBC with Differential/Platelet -     Comprehensive metabolic panel -     TSH  Hyperlipidemia, unspecified hyperlipidemia type -     Lipid panel -     B12 and Folate Panel  Vitamin D deficiency Assessment & Plan: History of gastric sleeve. Will check vitamin D levels  Orders: -     VITAMIN D 25 Hydroxy (Vit-D Deficiency, Fractures)  Gastroesophageal reflux disease, unspecified whether esophagitis present Assessment & Plan: Continue omeprazole 20 Mg daily. Will check B12 and folate level     Follow-up: Return in about 6 months (around 04/30/2024) for chronic management.   Kara Dies, NP

## 2023-10-31 NOTE — Patient Instructions (Signed)
Please go to the lab for blood work. Send [place] the MyChart message with the weight changes in 1 month.

## 2023-11-09 ENCOUNTER — Other Ambulatory Visit: Payer: Self-pay | Admitting: Nurse Practitioner

## 2023-11-13 DIAGNOSIS — K219 Gastro-esophageal reflux disease without esophagitis: Secondary | ICD-10-CM | POA: Insufficient documentation

## 2023-11-13 NOTE — Assessment & Plan Note (Addendum)
History of gastric sleeve. Will check vitamin D levels

## 2023-11-13 NOTE — Progress Notes (Signed)
The blood work shows no sign of anemia, potassium slightly low increased intake of food containing potassium. Potassium rich diet including more dry fruits, spinach, broccoli, beans, potatoes, cantaloupe, avocado, coconut water and salmon. Liver, kidney and thyroid normal. Cholesterol improved compared to the previous labs continue diet management and exercise.   Vitamin D low take over-the-counter vitamin D supplements daily. Vitamin B12 low, need vitamin B12 injection weekly for 4 to 8 weeks, followed by once monthly as maintenance.  Please schedule appointment for B12 injection as patient is agreeable. Folate low take over-the-counter folic acid supplement daily and include intake of food like spinach, kale, legumes and citrus foods.

## 2023-11-13 NOTE — Assessment & Plan Note (Signed)
Continue omeprazole 20 Mg daily. Will check B12 and folate level

## 2023-11-13 NOTE — Assessment & Plan Note (Signed)
Patient BP  Vitals:   10/31/23 0759  BP: 120/80    in the office. Advised pt to follow a low sodium and heart healthy diet. Continue HCTZ daily.

## 2023-11-24 ENCOUNTER — Ambulatory Visit (INDEPENDENT_AMBULATORY_CARE_PROVIDER_SITE_OTHER): Payer: BC Managed Care – PPO

## 2023-11-24 DIAGNOSIS — E538 Deficiency of other specified B group vitamins: Secondary | ICD-10-CM | POA: Diagnosis not present

## 2023-11-24 MED ORDER — CYANOCOBALAMIN 1000 MCG/ML IJ SOLN
1000.0000 ug | Freq: Once | INTRAMUSCULAR | Status: AC
  Administered 2023-11-24: 1000 ug via INTRAMUSCULAR

## 2023-11-24 NOTE — Progress Notes (Signed)
Pt presents for weekly b12 injection. Administered in R deltoid. Tolerated well with no complaints or concerns at this time. Pt will complete 3 more weekly injections then switch to monthly.

## 2023-12-01 ENCOUNTER — Ambulatory Visit (INDEPENDENT_AMBULATORY_CARE_PROVIDER_SITE_OTHER): Payer: BC Managed Care – PPO

## 2023-12-01 DIAGNOSIS — E538 Deficiency of other specified B group vitamins: Secondary | ICD-10-CM

## 2023-12-01 MED ORDER — CYANOCOBALAMIN 1000 MCG/ML IJ SOLN
1000.0000 ug | Freq: Once | INTRAMUSCULAR | Status: AC
  Administered 2023-12-01: 1000 ug via INTRAMUSCULAR

## 2023-12-01 NOTE — Progress Notes (Signed)
 Pt presented for their vitamin B12 injection. Pt was identified through two identifiers. Pt tolerated shot well in their left deltoid.

## 2023-12-08 ENCOUNTER — Ambulatory Visit (INDEPENDENT_AMBULATORY_CARE_PROVIDER_SITE_OTHER): Payer: BC Managed Care – PPO

## 2023-12-08 DIAGNOSIS — E538 Deficiency of other specified B group vitamins: Secondary | ICD-10-CM | POA: Diagnosis not present

## 2023-12-08 MED ORDER — CYANOCOBALAMIN 1000 MCG/ML IJ SOLN
1000.0000 ug | Freq: Once | INTRAMUSCULAR | Status: AC
  Administered 2023-12-08: 1000 ug via INTRAMUSCULAR

## 2023-12-08 NOTE — Progress Notes (Signed)
 Pt presented for their vitamin B12 injection. Pt was identified through two identifiers. Pt tolerated shot well in their right deltoid.

## 2023-12-15 ENCOUNTER — Ambulatory Visit: Payer: BC Managed Care – PPO

## 2023-12-15 DIAGNOSIS — E538 Deficiency of other specified B group vitamins: Secondary | ICD-10-CM | POA: Diagnosis not present

## 2023-12-15 MED ORDER — CYANOCOBALAMIN 1000 MCG/ML IJ SOLN
1000.0000 ug | Freq: Once | INTRAMUSCULAR | Status: AC
  Administered 2023-12-15: 1000 ug via INTRAMUSCULAR

## 2023-12-15 NOTE — Progress Notes (Signed)
Pt presented for their vitamin B12 injection. Pt was identified through two identifiers. Pt tolerated shot well in their left  deltoid.  

## 2023-12-22 ENCOUNTER — Ambulatory Visit (INDEPENDENT_AMBULATORY_CARE_PROVIDER_SITE_OTHER): Payer: BC Managed Care – PPO

## 2023-12-22 DIAGNOSIS — E538 Deficiency of other specified B group vitamins: Secondary | ICD-10-CM | POA: Diagnosis not present

## 2023-12-22 MED ORDER — CYANOCOBALAMIN 1000 MCG/ML IJ SOLN
1000.0000 ug | Freq: Once | INTRAMUSCULAR | Status: AC
  Administered 2023-12-22: 1000 ug via INTRAMUSCULAR

## 2023-12-22 NOTE — Progress Notes (Signed)
Pt presented for their vitamin B12 injection. Pt was identified through two identifiers. Pt tolerated shot well in their right deltoid.

## 2023-12-29 ENCOUNTER — Encounter: Payer: Self-pay | Admitting: Nurse Practitioner

## 2023-12-29 ENCOUNTER — Ambulatory Visit (INDEPENDENT_AMBULATORY_CARE_PROVIDER_SITE_OTHER): Payer: BC Managed Care – PPO

## 2023-12-29 DIAGNOSIS — E538 Deficiency of other specified B group vitamins: Secondary | ICD-10-CM

## 2023-12-29 MED ORDER — CYANOCOBALAMIN 1000 MCG/ML IJ SOLN
1000.0000 ug | Freq: Once | INTRAMUSCULAR | Status: AC
  Administered 2023-12-29: 1000 ug via INTRAMUSCULAR

## 2023-12-29 NOTE — Progress Notes (Signed)
 Pt presented for their vitamin B12 injection. Pt was identified through two identifiers. Pt tolerated shot well in their left deltoid.

## 2024-01-01 ENCOUNTER — Other Ambulatory Visit: Payer: Self-pay | Admitting: Nurse Practitioner

## 2024-01-01 DIAGNOSIS — E538 Deficiency of other specified B group vitamins: Secondary | ICD-10-CM

## 2024-01-09 ENCOUNTER — Other Ambulatory Visit (INDEPENDENT_AMBULATORY_CARE_PROVIDER_SITE_OTHER): Payer: BC Managed Care – PPO

## 2024-01-09 DIAGNOSIS — E538 Deficiency of other specified B group vitamins: Secondary | ICD-10-CM

## 2024-01-12 LAB — METHYLMALONIC ACID, SERUM: Methylmalonic Acid, Quant: 129 nmol/L (ref 55–335)

## 2024-01-12 LAB — INTRINSIC FACTOR ANTIBODIES: Intrinsic Factor: NEGATIVE

## 2024-01-15 ENCOUNTER — Encounter: Payer: Self-pay | Admitting: Nurse Practitioner

## 2024-02-28 ENCOUNTER — Other Ambulatory Visit: Payer: Self-pay | Admitting: Nurse Practitioner

## 2024-04-27 ENCOUNTER — Other Ambulatory Visit: Payer: Self-pay | Admitting: Internal Medicine

## 2024-04-30 ENCOUNTER — Ambulatory Visit: Payer: BC Managed Care – PPO | Admitting: Nurse Practitioner

## 2024-05-06 ENCOUNTER — Ambulatory Visit: Admitting: Nurse Practitioner

## 2024-05-13 ENCOUNTER — Ambulatory Visit
Admission: EM | Admit: 2024-05-13 | Discharge: 2024-05-13 | Disposition: A | Discharge status: Home / Self Care | Attending: Emergency Medicine | Admitting: Emergency Medicine

## 2024-05-13 ENCOUNTER — Ambulatory Visit: Admitting: Nurse Practitioner

## 2024-05-13 DIAGNOSIS — J0100 Acute maxillary sinusitis, unspecified: Secondary | ICD-10-CM | POA: Diagnosis not present

## 2024-05-13 MED ORDER — CEFDINIR 300 MG PO CAPS: 300.0000 mg | ORAL_CAPSULE | Freq: Two times a day (BID) | ORAL | 0 refills | Status: AC

## 2024-05-13 NOTE — Discharge Instructions (Signed)
Take the cefdinir as directed.  Follow up with your primary care provider.    

## 2024-05-13 NOTE — ED Provider Notes (Signed)
 Krista Ferguson    CSN: [id number] Arrival date & time: 05/13/24  1004      History   Chief Complaint Chief Complaint  Patient presents with   Cough    HPI Krista Ferguson is a 48 y.o. female.  Patient presents with 4-week history of congestion, postnasal drip, cough.  No fever, shortness of breath, wheezing.  No OTC medications taken today.  She has been treating her symptoms with Mucinex and allergy medication.  The history is provided by the patient and medical records.    Past Medical History:  Diagnosis Date   Arthritis    Psosoratic   Atypical mole    lower abd, lower back ?   GERD (gastroesophageal reflux disease)     Patient Active Problem List   Diagnosis Date Noted   Gastroesophageal reflux disease 11/13/2023   Hyperlipidemia 10/31/2023   Atypical mole    Allergy 04/18/2023   Anxiety 04/18/2023   Back pain 04/18/2023   Depression 04/18/2023   Kidney disease 04/18/2023   Leg pain, right 04/18/2023   Health maintenance examination 04/18/2023   Hypokalemia 04/18/2023   Lymphadenopathy 02/22/2023   Class 1 obesity with body mass index (BMI) of 32.0 to 32.9 in adult 01/19/2023   Lower extremity edema 12/13/2022   Encounter for screening colonoscopy 03/24/2022   Annual physical exam 03/19/2021   S/P panniculectomy 11/29/2020   Panniculitis 08/18/2020   Acute non-recurrent maxillary sinusitis 07/05/2020   Alopecia 07/05/2020   Acute recurrent maxillary sinusitis 07/05/2020   Candidiasis 07/05/2020   Yeast dermatitis 06/15/2020   Metabolic syndrome 06/15/2020   Psoriasis, unspecified 06/15/2020   Seasonal allergic rhinitis due to pollen 04/13/2020   Essential hypertension 04/13/2020   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   Vitamin D  deficiency 01/02/2015   Subacute frontal sinusitis 12/03/2014    Past Surgical History:  Procedure Laterality Date   ABDOMINAL HYSTERECTOMY      Partial   CESAREAN SECTION  1995   CHOLECYSTECTOMY     COLONOSCOPY WITH PROPOFOL  N/A 05/03/2022   Procedure: COLONOSCOPY WITH PROPOFOL ;  Surgeon: Selena Daily, MD;  Location: [facility];  Service: Gastroenterology;  Laterality: N/A;   LAPAROSCOPIC GASTRIC SLEEVE RESECTION N/A 03/02/2018   Procedure: LAPAROSCOPIC GASTRIC SLEEVE RESECTION WITH HIATAL HERNIA REPAIR AND UPPER ENDOSCOPY;  Surgeon: Jacolyn Matar, MD;  Location: [facility];  Service: General;  Laterality: N/A;   SINUSOTOMY  2011    OB History   No obstetric history on file.      Home Medications    Prior to Admission medications   Medication Sig Start Date End Date Taking? Authorizing Provider  cefdinir  (OMNICEF ) 300 MG capsule Take 1 capsule (300 mg total) by mouth 2 (two) times daily for 7 days. 05/13/24 05/20/24 Yes Wellington Half, NP  Calcipotriene-Betameth Diprop 0.005-0.064 % FOAM Apply 1 application topically daily as needed (for psoriasis).     [provider]  clindamycin  (CLEOCIN -T) 1 % lotion Apply to affected area once to twice daily 06/30/23   Artemio Larry, MD  finasteride  (PROSCAR ) 5 MG tablet Take 1 tablet (5 mg total) by mouth daily. 06/30/23   Artemio Larry, MD  hydrochlorothiazide  (MICROZIDE ) 12.5 MG capsule TAKE ONE CAPSULE BY MOUTH ONE TIME DAILY 03/01/24   Kaur, Charanpreet, NP  ketoconazole  (NIZORAL ) 2 % shampoo Apply 1 Application topically 2 (two) times a week. Wash scalp 2 times weekly, let sit 5 minutes and rinse out 06/30/23  Artemio Larry, MD  minoxidil  (LONITEN ) 2.5 MG tablet Take 1 tablet (2.5 mg total) by mouth daily. 06/30/23   Artemio Larry, MD  modafinil (PROVIGIL) 100 MG tablet Take 50 mg by mouth 2 (two) times daily. Patient not taking: Reported on 10/31/2023 02/22/22   [provider]  omeprazole  (PRILOSEC) 20 MG capsule TAKE 1 CAPSULE BY MOUTH EVERY DAY 03/13/23   Kaur, Charanpreet, NP  Tapinarof  (VTAMA ) 1 % CREA Apply 1 application  topically in the morning and at bedtime.  Apply to affected area ears as needed for psoriasis. 06/30/23   Artemio Larry, MD  tirzepatide  (ZEPBOUND ) 2.5 MG/0.5ML Pen Inject 2.5 mg into the skin once a week. Patient not taking: Reported on 10/31/2023 04/18/23   Tona Francis, NP  ZEPBOUND  5 MG/0.5ML Pen INJECT THE CONTENTS OF ONE PEN UNDER THE SKIN ONCE WEEKLY ON THE SAME DAY EACH WEEK. 04/29/24   Tona Francis, NP    Family History Family History  Problem Relation Age of Onset   Hypertension Other    Diabetes Other    Stroke Other    Cancer Other    Asthma Other    COPD Other    Breast cancer Maternal Aunt    Breast cancer Maternal Grandmother     Social History Social History   Tobacco Use   Smoking status: Never   Smokeless tobacco: Never  Vaping Use   Vaping status: Never Used  Substance Use Topics   Alcohol use: Yes    Comment: Rare-2 times a year   Drug use: Never     Allergies   Elemental sulfur, Levaquin [levofloxacin in d5w], Sulfa antibiotics, and Augmentin [amoxicillin-pot clavulanate]   Review of Systems Review of Systems  Constitutional:  Negative for chills and fever.  HENT:  Positive for congestion and postnasal drip. Negative for ear pain and sore throat.   Respiratory:  Positive for cough. Negative for shortness of breath and wheezing.      Physical Exam Triage Vital Signs ED Triage Vitals  Encounter Vitals Group     BP 05/13/24 1014 107/74     Girls Systolic BP Percentile --      Girls Diastolic BP Percentile --      Boys Systolic BP Percentile --      Boys Diastolic BP Percentile --      Pulse Rate 05/13/24 1014 83     Resp 05/13/24 1014 18     Temp 05/13/24 1014 98 F (36.7 C)     Temp src --      SpO2 05/13/24 1014 96 %     Weight --      Height --      Head Circumference --      Peak Flow --      Pain Score 05/13/24 1016 0     Pain Loc --      Pain Education --      Exclude from Growth Chart --    No data found.  Updated Vital Signs BP 107/74   Pulse 83   Temp  98 F (36.7 C)   Resp 18   SpO2 96%   Visual Acuity Right Eye Distance:   Left Eye Distance:   Bilateral Distance:    Right Eye Near:   Left Eye Near:    Bilateral Near:     Physical Exam Constitutional:      General: She is not in acute distress. HENT:     Right Ear: Tympanic membrane normal.  Left Ear: Tympanic membrane normal.     Nose: Nose normal.     Mouth/Throat:     Mouth: Mucous membranes are moist.     Pharynx: Oropharynx is clear.     Comments: PND  Cardiovascular:     Rate and Rhythm: Normal rate and regular rhythm.     Heart sounds: Normal heart sounds.  Pulmonary:     Effort: Pulmonary effort is normal. No respiratory distress.     Breath sounds: Normal breath sounds.   Neurological:     Mental Status: She is alert.      UC Treatments / Results  Labs (all labs ordered are listed, but only abnormal results are displayed) Labs Reviewed - No data to display  EKG   Radiology No results found.  Procedures Procedures (including critical care time)  Medications Ordered in UC Medications - No data to display  Initial Impression / Assessment and Plan / UC Course  I have reviewed the triage vital signs and the nursing notes.  Pertinent labs & imaging results that were available during my care of the patient were reviewed by me and considered in my medical decision making (see chart for details).    Acute sinusitis.  Afebrile and vital signs are stable.  Patient has been symptomatic for 4 weeks and is not improving with OTC treatment.  Treating today with cefdinir  as this has worked well for the patient in the past.  Education provided on sinus infection.  Instructed her to follow-up with her PCP.  She agrees to plan of care.  Final Clinical Impressions(s) / UC Diagnoses   Final diagnoses:  Acute non-recurrent maxillary sinusitis     Discharge Instructions      Take the cefdinir  as directed.  Follow up with your primary care provider.         ED Prescriptions     Medication Sig Dispense Auth. Provider   cefdinir  (OMNICEF ) 300 MG capsule Take 1 capsule (300 mg total) by mouth 2 (two) times daily for 7 days. 14 capsule Wellington Half, NP      PDMP not reviewed this encounter.   Wellington Half, NP 05/13/24 1040

## 2024-05-13 NOTE — ED Triage Notes (Signed)
 Pt c/o cough & congestion x4 wks. Denies any wheezing or sob. Has tried OTC meds w/o relief.

## 2024-05-17 ENCOUNTER — Telehealth: Payer: Self-pay

## 2024-05-17 NOTE — Telephone Encounter (Signed)
 Called Patient due to UC sending a fax stating she needs a follow up from a sinus infection. Patient states she is good now that they prescribed medication. Patient  also states she sees us  on 06/10/24 for her 6 month follow up.

## 2024-06-09 NOTE — Progress Notes (Unsigned)
 Established Patient Office Visit  Subjective:  Patient ID: Krista Ferguson, female    DOB: [birth year]  Age: 48 y.o. MRN: [id number]  CC: No chief complaint on file. Discussed the use of a AI scribe software for clinical note transcription with the patient, who gave verbal consent to proceed.   HPI  Krista Ferguson presents for chronic disease follow-up.  She has history of hypertension, hyperlipidemia, GERD and history of gastric sleeve.    HPI   Past Medical History:  Diagnosis Date   Arthritis    Psosoratic   Atypical mole    lower abd, lower back ?   GERD (gastroesophageal reflux disease)     Past Surgical History:  Procedure Laterality Date   ABDOMINAL HYSTERECTOMY     Partial   CESAREAN SECTION  1995   CHOLECYSTECTOMY     COLONOSCOPY WITH PROPOFOL  N/A 05/03/2022   Procedure: COLONOSCOPY WITH PROPOFOL ;  Surgeon: Unk Corinn Skiff, MD;  Location: [facility];  Service: Gastroenterology;  Laterality: N/A;   LAPAROSCOPIC GASTRIC SLEEVE RESECTION N/A 03/02/2018   Procedure: LAPAROSCOPIC GASTRIC SLEEVE RESECTION WITH HIATAL HERNIA REPAIR AND UPPER ENDOSCOPY;  Surgeon: Gladis Cough, MD;  Location: [facility];  Service: General;  Laterality: N/A;   SINUSOTOMY  2011    Family History  Problem Relation Age of Onset   Hypertension Other    Diabetes Other    Stroke Other    Cancer Other    Asthma Other    COPD Other    Breast cancer Maternal Aunt    Breast cancer Maternal Grandmother     Social History   Socioeconomic History   Marital status: Divorced    Spouse name: Not on file   Number of children: Not on file   Years of education: Not on file   Highest education level: Not on file  Occupational History   Not on file  Tobacco Use   Smoking status: Never   Smokeless tobacco: Never  Vaping Use   Vaping status: Never Used  Substance and Sexual Activity   Alcohol use: Yes    Comment: Rare-2 times a year   Drug use: Never   Sexual  activity: Yes  Other Topics Concern   Not on file  Social History Narrative   Not on file   Social Drivers of Health   Financial Resource Strain: Not on file  Food Insecurity: Not on file  Transportation Needs: Not on file  Physical Activity: Not on file  Stress: Not on file  Social Connections: Not on file  Intimate Partner Violence: Not on file     Outpatient Medications Prior to Visit  Medication Sig Dispense Refill   Calcipotriene-Betameth Diprop 0.005-0.064 % FOAM Apply 1 application topically daily as needed (for psoriasis).      clindamycin  (CLEOCIN -T) 1 % lotion Apply to affected area once to twice daily 60 mL 6   finasteride  (PROSCAR ) 5 MG tablet Take 1 tablet (5 mg total) by mouth daily. 90 tablet 3   hydrochlorothiazide  (MICROZIDE ) 12.5 MG capsule TAKE ONE CAPSULE BY MOUTH ONE TIME DAILY 90 capsule 1   ketoconazole  (NIZORAL ) 2 % shampoo Apply 1 Application topically 2 (two) times a week. Wash scalp 2 times weekly, let sit 5 minutes and rinse out 120 mL 6   minoxidil  (LONITEN ) 2.5 MG tablet Take 1 tablet (2.5 mg total) by mouth daily. 90 tablet 3   modafinil (PROVIGIL) 100 MG tablet Take 50 mg by mouth 2 (two) times daily. (Patient  not taking: Reported on 10/31/2023)     omeprazole  (PRILOSEC) 20 MG capsule TAKE 1 CAPSULE BY MOUTH EVERY DAY 30 capsule 1   Tapinarof  (VTAMA ) 1 % CREA Apply 1 application  topically in the morning and at bedtime. Apply to affected area ears as needed for psoriasis. 60 g 5   tirzepatide  (ZEPBOUND ) 2.5 MG/0.5ML Pen Inject 2.5 mg into the skin once a week. (Patient not taking: Reported on 10/31/2023) 0.5 mL 5   ZEPBOUND  5 MG/0.5ML Pen INJECT THE CONTENTS OF ONE PEN UNDER THE SKIN ONCE WEEKLY ON THE SAME DAY EACH WEEK. 2 mL 5   No facility-administered medications prior to visit.    Allergies  Allergen Reactions   Elemental Sulfur Hives   Levaquin [Levofloxacin In D5w] Hives   Sulfa Antibiotics Hives   Augmentin [Amoxicillin-Pot Clavulanate]  Rash    Patient endorses full body rash which starts with her legs and progresses cephalic    ROS Review of Systems Negative unless indicated in HPI.    Objective:    Physical Exam Constitutional:      Appearance: Normal appearance.  HENT:     Mouth/Throat:     Mouth: Mucous membranes are moist.  Eyes:     Conjunctiva/sclera: Conjunctivae normal.     Pupils: Pupils are equal, round, and reactive to light.  Cardiovascular:     Rate and Rhythm: Normal rate and regular rhythm.     Pulses: Normal pulses.     Heart sounds: Normal heart sounds.  Pulmonary:     Effort: Pulmonary effort is normal.     Breath sounds: Normal breath sounds.  Abdominal:     General: Bowel sounds are normal.     Palpations: Abdomen is soft.  Musculoskeletal:     Cervical back: Normal range of motion. No tenderness.  Skin:    General: Skin is warm.     Findings: No bruising.  Neurological:     General: No focal deficit present.     Mental Status: She is alert and oriented to person, place, and time. Mental status is at baseline.  Psychiatric:        Mood and Affect: Mood normal.        Behavior: Behavior normal.        Thought Content: Thought content normal.        Judgment: Judgment normal.     There were no vitals taken for this visit. Wt Readings from Last 3 Encounters:  10/31/23 143 lb 6.4 oz (65 kg)  04/18/23 183 lb 3.2 oz (83.1 kg)  03/13/23 178 lb 3.2 oz (80.8 kg)     Health Maintenance  Topic Date Due   Hepatitis B Vaccines (1 of 3 - 19+ 3-dose series) Never done   Cervical Cancer Screening (HPV/Pap Cotest)  Never done   INFLUENZA VACCINE  06/25/2024   Colonoscopy  05/03/2032   Hepatitis C Screening  Completed   HIV Screening  Completed   HPV VACCINES  Aged Out   Meningococcal B Vaccine  Aged Out   DTaP/Tdap/Td  Discontinued   COVID-19 Vaccine  Discontinued       Topic Date Due   Hepatitis B Vaccines (1 of 3 - 19+ 3-dose series) Never done    Lab Results  Component  Value Date   TSH 1.00 10/31/2023   Lab Results  Component Value Date   WBC 8.1 10/31/2023   HGB 14.7 10/31/2023   HCT 41.1 10/31/2023   MCV 94.1 10/31/2023   PLT  408.0 (H) 10/31/2023   Lab Results  Component Value Date   NA 139 10/31/2023   K 3.4 (L) 10/31/2023   CO2 32 10/31/2023   GLUCOSE 79 10/31/2023   BUN 10 10/31/2023   CREATININE 0.75 10/31/2023   BILITOT 1.0 10/31/2023   ALKPHOS 53 10/31/2023   AST 10 10/31/2023   ALT 7 10/31/2023   PROT 7.1 10/31/2023   ALBUMIN 4.3 10/31/2023   CALCIUM 9.1 10/31/2023   ANIONGAP 11 03/02/2018   EGFR 97 03/21/2022   GFR 94.99 10/31/2023   Lab Results  Component Value Date   CHOL 174 10/31/2023   Lab Results  Component Value Date   HDL 47.50 10/31/2023   Lab Results  Component Value Date   LDLCALC 111 (H) 10/31/2023   Lab Results  Component Value Date   TRIG 80.0 10/31/2023   Lab Results  Component Value Date   CHOLHDL 4 10/31/2023   No results found for: HGBA1C    Assessment & Plan:  There are no diagnoses linked to this encounter.  Follow-up: No follow-ups on file.   Kimora Stankovic, NP

## 2024-06-10 ENCOUNTER — Encounter: Payer: Self-pay | Admitting: Nurse Practitioner

## 2024-06-10 ENCOUNTER — Ambulatory Visit: Payer: Self-pay | Admitting: Nurse Practitioner

## 2024-06-10 ENCOUNTER — Ambulatory Visit: Admitting: Nurse Practitioner

## 2024-06-10 VITALS — BP 100/70 | HR 68 | Temp 97.7°F | Resp 20 | Ht 63.75 in | Wt 127.0 lb

## 2024-06-10 DIAGNOSIS — F419 Anxiety disorder, unspecified: Secondary | ICD-10-CM

## 2024-06-10 DIAGNOSIS — E569 Vitamin deficiency, unspecified: Secondary | ICD-10-CM

## 2024-06-10 DIAGNOSIS — I10 Essential (primary) hypertension: Secondary | ICD-10-CM | POA: Diagnosis not present

## 2024-06-10 DIAGNOSIS — E785 Hyperlipidemia, unspecified: Secondary | ICD-10-CM | POA: Diagnosis not present

## 2024-06-10 DIAGNOSIS — J302 Other seasonal allergic rhinitis: Secondary | ICD-10-CM

## 2024-06-10 DIAGNOSIS — E559 Vitamin D deficiency, unspecified: Secondary | ICD-10-CM

## 2024-06-10 LAB — COMPREHENSIVE METABOLIC PANEL WITH GFR
ALT: 7 U/L (ref 0–35)
AST: 12 U/L (ref 0–37)
Albumin: 4.6 g/dL (ref 3.5–5.2)
Alkaline Phosphatase: 48 U/L (ref 39–117)
BUN: 8 mg/dL (ref 6–23)
CO2: 33 meq/L — ABNORMAL HIGH (ref 19–32)
Calcium: 9.4 mg/dL (ref 8.4–10.5)
Chloride: 98 meq/L (ref 96–112)
Creatinine, Ser: 0.71 mg/dL (ref 0.40–1.20)
GFR: 101.02 mL/min (ref >60.00)
Glucose, Bld: 70 mg/dL (ref 70–99)
Potassium: 3.5 meq/L (ref 3.5–5.1)
Sodium: 139 meq/L (ref 135–145)
Total Bilirubin: 1 mg/dL (ref 0.2–1.2)
Total Protein: 7.3 g/dL (ref 6.0–8.3)

## 2024-06-10 LAB — CBC WITH DIFFERENTIAL/PLATELET
Basophils Absolute: 0.1 K/uL (ref 0.0–0.1)
Basophils Relative: 0.9 % (ref 0.0–3.0)
Eosinophils Absolute: 0 K/uL (ref 0.0–0.7)
Eosinophils Relative: 0.8 % (ref 0.0–5.0)
HCT: 41.6 % (ref 36.0–46.0)
Hemoglobin: 14.1 g/dL (ref 12.0–15.0)
Lymphocytes Relative: 40.9 % (ref 12.0–46.0)
Lymphs Abs: 2.2 K/uL (ref 0.7–4.0)
MCHC: 34 g/dL (ref 30.0–36.0)
MCV: 93.5 fl (ref 78.0–100.0)
Monocytes Absolute: 0.4 K/uL (ref 0.1–1.0)
Monocytes Relative: 8 % (ref 3.0–12.0)
Neutro Abs: 2.7 K/uL (ref 1.4–7.7)
Neutrophils Relative %: 49.4 % (ref 43.0–77.0)
Platelets: 358 K/uL (ref 150.0–400.0)
RBC: 4.45 Mil/uL (ref 3.87–5.11)
RDW: 12.9 % (ref 11.5–15.5)
WBC: 5.5 K/uL (ref 4.0–10.5)

## 2024-06-10 LAB — LIPID PANEL
Cholesterol: 186 mg/dL (ref 0–200)
HDL: 68.2 mg/dL (ref >39.00)
LDL Cholesterol: 103 mg/dL — ABNORMAL HIGH (ref 0–99)
NonHDL: 118.03
Total CHOL/HDL Ratio: 3
Triglycerides: 75 mg/dL (ref 0.0–149.0)
VLDL: 15 mg/dL (ref 0.0–40.0)

## 2024-06-10 LAB — VITAMIN D 25 HYDROXY (VIT D DEFICIENCY, FRACTURES): VITD: 27.22 ng/mL — ABNORMAL LOW (ref 30.00–100.00)

## 2024-06-10 LAB — B12 AND FOLATE PANEL
Folate: 15.3 ng/mL (ref >5.9)
Vitamin B-12: 259 pg/mL (ref 211–911)

## 2024-06-10 LAB — TSH: TSH: 1.28 u[IU]/mL (ref 0.35–5.50)

## 2024-06-10 MED ORDER — DULOXETINE HCL 20 MG PO CPEP: 20.0000 mg | ORAL_CAPSULE | Freq: Every day | ORAL | 1 refills | Status: DC

## 2024-06-10 NOTE — Assessment & Plan Note (Signed)
 Vitamin D  low on previous lab. -Will check Vit D level.

## 2024-06-10 NOTE — Assessment & Plan Note (Signed)
 Patient BP  Vitals:   06/10/24 0808  BP: 100/70    in the office. -Continue HCTZ daily.

## 2024-06-10 NOTE — Patient Instructions (Signed)
 YOUR PLAN:  WEIGHT LOSS: You have experienced weight loss due to stress and skipping meals. -We will adjust your medication schedule to every two weeks to monitor your weight gain. -Please monitor your weight regularly and we will adjust the plan as needed.  ANXIETY AND INSOMNIA: Your anxiety and insomnia have increased due to recent family losses. -We have prescribed Cymbalta  20 mg daily, which can be increased to 40 mg if needed. -Monitor for any side effects and effectiveness of the medication. -If your anxiety persists, we may refer you to a therapist. -Please schedule a follow-up appointment in one month.  POSTNASAL DRIP: You have persistent postnasal drip likely due to allergies. -Switch from Zyrtec to Allegra for better management. -Take Mucinex to help thin the secretions. -Use steam inhalation and drink warm tea with honey and cinnamon. -Monitor your symptoms and let us  know if there is no improvement.  VITAMIN D  DEFICIENCY: You have low vitamin D  levels. -We will order blood tests to re-evaluate your vitamin D  levels. -If your levels remain low, we may consider high-dose vitamin D  supplementation.

## 2024-06-10 NOTE — Assessment & Plan Note (Signed)
 Low vit B12 and folate. Was on B12 injection. -Will check Vitamin.

## 2024-06-10 NOTE — Assessment & Plan Note (Deleted)
 Persistent postnasal drip likely from allergies. Allegra recommended for better management. - Switch from Zyrtec to Allegra. - Recommend Mucinex to thin secretions. - Advise steam inhalation and warm tea with honey and cinnamon. - Monitor symptoms and consider further intervention if no improvement.

## 2024-06-10 NOTE — Assessment & Plan Note (Signed)
 Persistent postnasal drip likely from allergies. Allegra recommended for better management. - Switch from Zyrtec to Allegra. - Recommend Mucinex to thin secretions. - Advise steam inhalation and warm tea with honey and cinnamon. - Monitor symptoms and consider further intervention if no improvement.

## 2024-06-10 NOTE — Assessment & Plan Note (Signed)
 Lab Results  Component Value Date   CHOL 174 10/31/2023   HDL 47.50 10/31/2023   LDLCALC 111 (H) 10/31/2023   TRIG 80.0 10/31/2023   CHOLHDL 4 10/31/2023  - Will check lipid panel.

## 2024-06-10 NOTE — Assessment & Plan Note (Signed)
 Pt was on Cymbalta  in 2018 and was seening Dr. Chipper but was able to mange without medication. Recent increased  anxiety and insomnia due to stress and family losses.  - Prescribe Cymbalta  20 mg daily, increase to 40 mg if needed. - Monitor for side effects and effectiveness. - Consider therapist referral if anxiety persists. - Schedule follow-up in one month.

## 2024-07-08 NOTE — Progress Notes (Signed)
 Established Patient Office Visit  Subjective:  Patient ID: Krista Ferguson, female    DOB: 1976/09/14  Age: 48 y.o. MRN: [id number]  CC:  Chief Complaint  Patient presents with   Medical Management of Chronic Issues   Discussed the use of a AI scribe software for clinical note transcription with the patient, who gave verbal consent to proceed.  HPI  Krista Ferguson presents for follow up on anxiety.   She experiences night sweats and hot flashes approximately eight hours after taking Cymbalta  at bedtime. These symptoms were not present before starting the medication. Despite these symptoms, she feels the medication is at the right level and does not wish to change it.  She has no other concerns at present.    HPI   Past Medical History:  Diagnosis Date   Arthritis    Psosoratic   Atypical mole    lower abd, lower back ?   GERD (gastroesophageal reflux disease)     Past Surgical History:  Procedure Laterality Date   ABDOMINAL HYSTERECTOMY     Partial   CESAREAN SECTION  1995   CHOLECYSTECTOMY     COLONOSCOPY WITH PROPOFOL  N/A 05/03/2022   Procedure: COLONOSCOPY WITH PROPOFOL ;  Surgeon: Unk Corinn Skiff, MD;  Location: [facility] ENDOSCOPY;  Service: Gastroenterology;  Laterality: N/A;   LAPAROSCOPIC GASTRIC SLEEVE RESECTION N/A 03/02/2018   Procedure: LAPAROSCOPIC GASTRIC SLEEVE RESECTION WITH HIATAL HERNIA REPAIR AND UPPER ENDOSCOPY;  Surgeon: Gladis Cough, MD;  Location: [facility] ORS;  Service: General;  Laterality: N/A;   SINUSOTOMY  2011    Family History  Problem Relation Age of Onset   Hypertension Other    Diabetes Other    Stroke Other    Cancer Other    Asthma Other    COPD Other    Breast cancer Maternal Aunt    Breast cancer Maternal Grandmother     Social History   Socioeconomic History   Marital status: Divorced    Spouse name: Not on file   Number of children: Not on file   Years of education: Not on file   Highest education  level: Not on file  Occupational History   Not on file  Tobacco Use   Smoking status: Never   Smokeless tobacco: Never  Vaping Use   Vaping status: Never Used  Substance and Sexual Activity   Alcohol use: Yes    Comment: Rare-2 times a year   Drug use: Never   Sexual activity: Yes  Other Topics Concern   Not on file  Social History Narrative   Not on file   Social Drivers of Health   Financial Resource Strain: Not on file  Food Insecurity: Not on file  Transportation Needs: Not on file  Physical Activity: Not on file  Stress: Not on file  Social Connections: Not on file  Intimate Partner Violence: Not on file     Outpatient Medications Prior to Visit  Medication Sig Dispense Refill   Calcipotriene-Betameth Diprop 0.005-0.064 % FOAM Apply 1 application topically daily as needed (for psoriasis).      clindamycin  (CLEOCIN -T) 1 % lotion Apply to affected area once to twice daily 60 mL 6   finasteride  (PROSCAR ) 5 MG tablet Take 1 tablet (5 mg total) by mouth daily. 90 tablet 3   ketoconazole  (NIZORAL ) 2 % shampoo Apply 1 Application topically 2 (two) times a week. Wash scalp 2 times weekly, let sit 5 minutes and rinse out 120 mL 6   minoxidil  (  LONITEN ) 2.5 MG tablet Take 1 tablet (2.5 mg total) by mouth daily. 90 tablet 3   Tapinarof  (VTAMA ) 1 % CREA Apply 1 application  topically in the morning and at bedtime. Apply to affected area ears as needed for psoriasis. 60 g 5   ZEPBOUND  5 MG/0.5ML Pen INJECT THE CONTENTS OF ONE PEN UNDER THE SKIN ONCE WEEKLY ON THE SAME DAY EACH WEEK. 2 mL 5   DULoxetine  (CYMBALTA ) 20 MG capsule Take 1 capsule (20 mg total) by mouth daily. 30 capsule 1   hydrochlorothiazide  (MICROZIDE ) 12.5 MG capsule TAKE ONE CAPSULE BY MOUTH ONE TIME DAILY 90 capsule 1   modafinil (PROVIGIL) 100 MG tablet Take 50 mg by mouth 2 (two) times daily. (Patient not taking: Reported on 07/09/2024)     omeprazole  (PRILOSEC) 20 MG capsule TAKE 1 CAPSULE BY MOUTH EVERY DAY  (Patient not taking: Reported on 07/09/2024) 30 capsule 1   tirzepatide  (ZEPBOUND ) 2.5 MG/0.5ML Pen Inject 2.5 mg into the skin once a week. (Patient not taking: Reported on 07/09/2024) 0.5 mL 5   No facility-administered medications prior to visit.    Allergies  Allergen Reactions   Elemental Sulfur Hives   Levaquin [Levofloxacin In D5w] Hives   Sulfa Antibiotics Hives and Other (See Comments)   Augmentin [Amoxicillin-Pot Clavulanate] Rash    Patient endorses full body rash which starts with her legs and progresses cephalic    ROS Review of Systems Negative unless indicated in HPI.    Objective:    Physical Exam Constitutional:      Appearance: Normal appearance.  Eyes:     Conjunctiva/sclera: Conjunctivae normal.     Pupils: Pupils are equal, round, and reactive to light.  Cardiovascular:     Rate and Rhythm: Normal rate and regular rhythm.     Pulses: Normal pulses.     Heart sounds: Normal heart sounds.  Pulmonary:     Effort: Pulmonary effort is normal.     Breath sounds: Normal breath sounds.  Musculoskeletal:     Cervical back: Normal range of motion. No tenderness.  Skin:    General: Skin is warm.     Findings: No bruising.  Neurological:     General: No focal deficit present.     Mental Status: She is alert and oriented to person, place, and time. Mental status is at baseline.  Psychiatric:        Mood and Affect: Mood normal.        Behavior: Behavior normal.        Thought Content: Thought content normal.        Judgment: Judgment normal.     BP 118/78   Pulse 72   Temp 97.9 F (36.6 C)   Ht 5' 3.75 (1.619 m)   Wt 127 lb 3.2 oz (57.7 kg)   SpO2 98%   BMI 22.01 kg/m  Wt Readings from Last 3 Encounters:  07/09/24 127 lb 3.2 oz (57.7 kg)  06/10/24 127 lb (57.6 kg)  10/31/23 143 lb 6.4 oz (65 kg)     Health Maintenance  Topic Date Due   Hepatitis B Vaccines 19-59 Average Risk (1 of 3 - 19+ 3-dose series) Never done   Cervical Cancer  Screening (HPV/Pap Cotest)  Never done   INFLUENZA VACCINE  02/22/2025 (Originally 06/25/2024)   Colonoscopy  05/03/2032   Hepatitis C Screening  Completed   HIV Screening  Completed   Pneumococcal Vaccine  Aged Out   HPV VACCINES  Aged Out  Meningococcal B Vaccine  Aged Out   DTaP/Tdap/Td  Discontinued   COVID-19 Vaccine  Discontinued       Topic Date Due   Hepatitis B Vaccines 19-59 Average Risk (1 of 3 - 19+ 3-dose series) Never done    Lab Results  Component Value Date   TSH 1.28 06/10/2024   Lab Results  Component Value Date   WBC 5.5 06/10/2024   HGB 14.1 06/10/2024   HCT 41.6 06/10/2024   MCV 93.5 06/10/2024   PLT 358.0 06/10/2024   Lab Results  Component Value Date   NA 139 06/10/2024   K 3.5 06/10/2024   CO2 33 (H) 06/10/2024   GLUCOSE 70 06/10/2024   BUN 8 06/10/2024   CREATININE 0.71 06/10/2024   BILITOT 1.0 06/10/2024   ALKPHOS 48 06/10/2024   AST 12 06/10/2024   ALT 7 06/10/2024   PROT 7.3 06/10/2024   ALBUMIN 4.6 06/10/2024   CALCIUM 9.4 06/10/2024   ANIONGAP 11 03/02/2018   EGFR 97 03/21/2022   GFR 101.02 06/10/2024   Lab Results  Component Value Date   CHOL 186 06/10/2024   Lab Results  Component Value Date   HDL 68.20 06/10/2024   Lab Results  Component Value Date   LDLCALC 103 (H) 06/10/2024   Lab Results  Component Value Date   TRIG 75.0 06/10/2024   Lab Results  Component Value Date   CHOLHDL 3 06/10/2024   No results found for: HGBA1C    Assessment & Plan:  Anxiety Assessment & Plan: PHQ 9 score 7 and GAD 7 score: 4 Symptoms well-managed with duloxetine  20 mg. Hot flashes likely due to duloxetine , not bothersome enough to change medication. - Continue duloxetine  20 mg daily.   Orders: -     DULoxetine  HCl; Take 1 capsule (20 mg total) by mouth daily.  Dispense: 90 capsule; Refill: 1  Other orders -     hydroCHLOROthiazide ; Take 1 capsule (12.5 mg total) by mouth daily.  Dispense: 90 capsule; Refill:  1    Follow-up: Return in about 4 months (around 11/08/2024) for chronic management.   Susana Duell, NP

## 2024-07-09 ENCOUNTER — Encounter: Payer: Self-pay | Admitting: Nurse Practitioner

## 2024-07-09 ENCOUNTER — Other Ambulatory Visit (HOSPITAL_COMMUNITY): Payer: Self-pay

## 2024-07-09 ENCOUNTER — Telehealth: Payer: Self-pay

## 2024-07-09 ENCOUNTER — Ambulatory Visit: Admitting: Nurse Practitioner

## 2024-07-09 VITALS — BP 118/78 | HR 72 | Temp 97.9°F | Ht 63.75 in | Wt 127.2 lb

## 2024-07-09 DIAGNOSIS — F419 Anxiety disorder, unspecified: Secondary | ICD-10-CM | POA: Diagnosis not present

## 2024-07-09 MED ORDER — HYDROCHLOROTHIAZIDE 12.5 MG PO CAPS: 12.5000 mg | ORAL_CAPSULE | Freq: Every day | ORAL | 1 refills | Status: DC

## 2024-07-09 MED ORDER — DULOXETINE HCL 20 MG PO CPEP: 20.0000 mg | ORAL_CAPSULE | Freq: Every day | ORAL | 1 refills | Status: DC

## 2024-07-09 NOTE — Assessment & Plan Note (Addendum)
 PHQ 9 score 7 and GAD 7 score: 4 Symptoms well-managed with duloxetine  20 mg. Hot flashes likely due to duloxetine , not bothersome enough to change medication. - Continue duloxetine  20 mg daily.

## 2024-07-09 NOTE — Telephone Encounter (Signed)
 Pharmacy Patient Advocate Encounter   Received notification from CoverMyMeds that prior authorization for Zepbound  5 is required/requested.   Insurance verification completed.   The patient is insured through [facility] .   Per test claim: PA required; PA submitted to above mentioned insurance via Latent Key/confirmation #/EOC [id number]&& Status is pending

## 2024-07-09 NOTE — Telephone Encounter (Signed)
 Pt notified via MyChart.

## 2024-07-09 NOTE — Telephone Encounter (Signed)
 Pharmacy Patient Advocate Encounter  Received notification from [facility] that Prior Authorization for Zepbound  5 has been APPROVED from 07/09/24 to 07/09/25. Ran test claim, Copay is $25.00. This test claim was processed through [facility]- copay amounts may vary at other pharmacies due to pharmacy/plan contracts, or as the patient moves through the different stages of their insurance plan.     PA #/Case ID/Reference #: [id number]

## 2024-07-19 ENCOUNTER — Ambulatory Visit: Payer: BC Managed Care – PPO | Admitting: Dermatology

## 2024-07-19 DIAGNOSIS — D489 Neoplasm of uncertain behavior, unspecified: Secondary | ICD-10-CM

## 2024-07-19 DIAGNOSIS — L578 Other skin changes due to chronic exposure to nonionizing radiation: Secondary | ICD-10-CM

## 2024-07-19 DIAGNOSIS — L739 Follicular disorder, unspecified: Secondary | ICD-10-CM

## 2024-07-19 DIAGNOSIS — D239 Other benign neoplasm of skin, unspecified: Secondary | ICD-10-CM

## 2024-07-19 DIAGNOSIS — Z1283 Encounter for screening for malignant neoplasm of skin: Secondary | ICD-10-CM

## 2024-07-19 DIAGNOSIS — D229 Melanocytic nevi, unspecified: Secondary | ICD-10-CM

## 2024-07-19 DIAGNOSIS — L729 Follicular cyst of the skin and subcutaneous tissue, unspecified: Secondary | ICD-10-CM

## 2024-07-19 DIAGNOSIS — W908XXA Exposure to other nonionizing radiation, initial encounter: Secondary | ICD-10-CM

## 2024-07-19 DIAGNOSIS — L409 Psoriasis, unspecified: Secondary | ICD-10-CM | POA: Diagnosis not present

## 2024-07-19 DIAGNOSIS — Z86018 Personal history of other benign neoplasm: Secondary | ICD-10-CM

## 2024-07-19 DIAGNOSIS — L814 Other melanin hyperpigmentation: Secondary | ICD-10-CM

## 2024-07-19 DIAGNOSIS — L649 Androgenic alopecia, unspecified: Secondary | ICD-10-CM | POA: Diagnosis not present

## 2024-07-19 DIAGNOSIS — L821 Other seborrheic keratosis: Secondary | ICD-10-CM

## 2024-07-19 DIAGNOSIS — D2271 Melanocytic nevi of right lower limb, including hip: Secondary | ICD-10-CM

## 2024-07-19 DIAGNOSIS — D225 Melanocytic nevi of trunk: Secondary | ICD-10-CM | POA: Diagnosis not present

## 2024-07-19 DIAGNOSIS — D2272 Melanocytic nevi of left lower limb, including hip: Secondary | ICD-10-CM

## 2024-07-19 DIAGNOSIS — D2371 Other benign neoplasm of skin of right lower limb, including hip: Secondary | ICD-10-CM

## 2024-07-19 HISTORY — DX: Other benign neoplasm of skin, unspecified: D23.9

## 2024-07-19 MED ORDER — KETOCONAZOLE 2 % EX SHAM: 1.0000 | MEDICATED_SHAMPOO | CUTANEOUS | 6 refills | Status: AC

## 2024-07-19 MED ORDER — MINOXIDIL 2.5 MG PO TABS: 2.5000 mg | ORAL_TABLET | Freq: Every day | ORAL | 3 refills | Status: AC

## 2024-07-19 MED ORDER — CLINDAMYCIN PHOSPHATE 1 % EX LOTN: TOPICAL_LOTION | CUTANEOUS | 6 refills | Status: AC

## 2024-07-19 MED ORDER — FINASTERIDE 5 MG PO TABS
5.0000 mg | ORAL_TABLET | Freq: Every day | ORAL | 3 refills | Status: AC
Start: 2024-07-19 — End: ?

## 2024-07-19 MED ORDER — VTAMA 1 % EX CREA: 1.0000 | TOPICAL_CREAM | Freq: Two times a day (BID) | CUTANEOUS | 5 refills | Status: AC

## 2024-07-19 NOTE — Patient Instructions (Addendum)
 Pre-Operative Instructions You are scheduled for a surgical procedure at [facility]. We recommend you read the following instructions. If you have any questions or concerns, please call the office at [phone].  Shower and wash the entire body with soap and water the day of your surgery paying special attention to cleansing at and around the planned surgery site.  Please continue to take your anticoagulants (blood thinners) as you normally     would before and after surgery if they were prescribed by a medical provider. Stopping them could be harmful to you. We have multiple tools in dermatology to stop the bleeding even if you take an anticoagulant. If you take over the counter blood thinner such as aspirin, Ibuprofen (Motrin, Advil and Nuprin), Naprosyn, Voltaren, Relafen, etc. that was not prescribed or recommended by a medical provider, we recommend that you stop taking it for a week before your surgery and wait to restart until 2 days after your surgery.  Please inform us  of all medications you are currently taking. All medications that are taken regularly should be taken the day of surgery as you always do. Nevertheless, we need to be informed of what medications you are taking prior to surgery to know whether they will affect the procedure or cause any complications.   Please inform us  of any medication allergies. Also inform us  of whether you have allergies to Latex or rubber products or whether you have had any adverse reaction to Lidocaine  or Epinephrine.  Please inform us  of any prosthetic or artificial body parts such as artificial heart valve, joint replacements, etc., or similar condition that might require preoperative antibiotics.   We recommend avoidance of alcohol at least two weeks prior to surgery and continued avoidance for at least two weeks after surgery.   We recommend discontinuation of tobacco smoking at least two weeks prior to surgery and continued  abstinence for at least two weeks after surgery.  Do not plan strenuous exercise, strenuous work or strenuous lifting for approximately four weeks after your surgery.   We request if you are unable to make your scheduled surgical appointment, please call us  at least a week in advance or as soon as you are aware of a problem so that we can cancel or reschedule the appointment.   You MAY TAKE TYLENOL  (acetaminophen ) for pain as it is not a blood thinner.   PLEASE PLAN TO BE IN TOWN FOR TWO WEEKS FOLLOWING SURGERY, THIS IS IMPORTANT SO YOU CAN BE CHECKED FOR DRESSING CHANGES, FUTURE REMOVAL AND TO MONITOR FOR POSSIBLE COMPLICATIONS.    Biopsy Wound Care Instructions  Leave the original bandage on for 24 hours if possible.  If the bandage becomes soaked or soiled before that time, it is OK to remove it and examine the wound.  A small amount of post-operative bleeding is normal.  If excessive bleeding occurs, remove the bandage, place gauze over the site and apply continuous pressure (no peeking) over the area for 30 minutes. If this does not work, please call our clinic as soon as possible or page your doctor if it is after hours.   Once a day, cleanse the wound with soap and water. It is fine to shower. If a thick crust develops you may use a Q-tip dipped into dilute hydrogen peroxide (mix 1:1 with water) to dissolve it.  Hydrogen peroxide can slow the healing process, so use it only as needed.    After washing, apply petroleum jelly (Vaseline) or  an antibiotic ointment if your doctor prescribed one for you, followed by a bandage.    For best healing, the wound should be covered with a layer of ointment at all times. If you are not able to keep the area covered with a bandage to hold the ointment in place, this may mean re-applying the ointment several times a day.  Continue this wound care until the wound has healed and is no longer open.   Itching and mild discomfort is normal during the healing  process. However, if you develop pain or severe itching, please call our office.   If you have any discomfort, you can take Tylenol  (acetaminophen ) or ibuprofen as directed on the bottle. (Please do not take these if you have an allergy to them or cannot take them for another reason).  Some redness, tenderness and white or yellow material in the wound is normal healing.  If the area becomes very sore and red, or develops a thick yellow-green material (pus), it may be infected; please notify us .    If you have stitches, return to clinic as directed to have the stitches removed. You will continue wound care for 2-3 days after the stitches are removed.   Wound healing continues for up to one year following surgery. It is not unusual to experience pain in the scar from time to time during the interval.  If the pain becomes severe or the scar thickens, you should notify the office.    A slight amount of redness in a scar is expected for the first six months.  After six months, the redness will fade and the scar will soften and fade.  The color difference becomes less noticeable with time.  If there are any problems, return for a post-op surgery check at your earliest convenience.  To improve the appearance of the scar, you can use silicone scar gel, cream, or sheets (such as Mederma or Serica) every night for up to one year. These are available over the counter (without a prescription).  Please call our office at [phone] for any questions or concerns.     Melanoma ABCDEs  Melanoma is the most dangerous type of skin cancer, and is the leading cause of death from skin disease.  You are more likely to develop melanoma if you: Have light-colored skin, light-colored eyes, or red or blond hair Spend a lot of time in the sun Tan regularly, either outdoors or in a tanning bed Have had blistering sunburns, especially during childhood Have a close family member who has had a melanoma Have atypical  moles or large birthmarks  Early detection of melanoma is key since treatment is typically straightforward and cure rates are extremely high if we catch it early.   The first sign of melanoma is often a change in a mole or a new dark spot.  The ABCDE system is a way of remembering the signs of melanoma.  A for asymmetry:  The two halves do not match. B for border:  The edges of the growth are irregular. C for color:  A mixture of colors are present instead of an even brown color. D for diameter:  Melanomas are usually (but not always) greater than 6mm - the size of a pencil eraser. E for evolution:  The spot keeps changing in size, shape, and color.  Please check your skin once per month between visits. You can use a small mirror in front and a large mirror behind you to keep an  eye on the back side or your body.   If you see any new or changing lesions before your next follow-up, please call to schedule a visit.  Please continue daily skin protection including broad spectrum sunscreen SPF 30+ to sun-exposed areas, reapplying every 2 hours as needed when you're outdoors.   Staying in the shade or wearing long sleeves, sun glasses (UVA+UVB protection) and wide brim hats (4-inch brim around the entire circumference of the hat) are also recommended for sun protection.    Due to recent changes in healthcare laws, you may see results of your pathology and/or laboratory studies on MyChart before the doctors have had a chance to review them. We understand that in some cases there may be results that are confusing or concerning to you. Please understand that not all results are received at the same time and often the doctors may need to interpret multiple results in order to provide you with the best plan of care or course of treatment. Therefore, we ask that you please give us  2 business days to thoroughly review all your results before contacting the office for clarification. Should we see a critical  lab result, you will be contacted sooner.   If You Need Anything After Your Visit  If you have any questions or concerns for your doctor, please call our main line at [phone] and press option 4 to reach your doctor's medical assistant. If no one answers, please leave a voicemail as directed and we will return your call as soon as possible. Messages left after 4 pm will be answered the following business day.   You may also send us  a message via MyChart. We typically respond to MyChart messages within 1-2 business days.  For prescription refills, please ask your pharmacy to contact our office. Our fax number is [phone].  If you have an urgent issue when the clinic is closed that cannot wait until the next business day, you can page your doctor at the number below.    Please note that while we do our best to be available for urgent issues outside of office hours, we are not available 24/7.   If you have an urgent issue and are unable to reach us , you may choose to seek medical care at your doctor's office, retail clinic, urgent care center, or emergency room.  If you have a medical emergency, please immediately call 911 or go to the emergency department.  Pager Numbers  - Dr. Hester: [phone]  - Dr. Jackquline: [phone]  - Dr. Claudene: [phone]   - Dr. Raymund: [phone]  In the event of inclement weather, please call our main line at [phone] for an update on the status of any delays or closures.  Dermatology Medication Tips: Please keep the boxes that topical medications come in in order to help keep track of the instructions about where and how to use these. Pharmacies typically print the medication instructions only on the boxes and not directly on the medication tubes.   If your medication is too expensive, please contact our office at [phone] option 4 or send us  a message through MyChart.   We are unable to tell what your co-pay for medications  will be in advance as this is different depending on your insurance coverage. However, we may be able to find a substitute medication at lower cost or fill out paperwork to get insurance to cover a needed medication.   If a prior authorization is required to get your medication  covered by your insurance company, please allow us  1-2 business days to complete this process.  Drug prices often vary depending on where the prescription is filled and some pharmacies may offer cheaper prices.  The website www.goodrx.com contains coupons for medications through different pharmacies. The prices here do not account for what the cost may be with help from insurance (it may be cheaper with your insurance), but the website can give you the price if you did not use any insurance.  - You can print the associated coupon and take it with your prescription to the pharmacy.  - You may also stop by our office during regular business hours and pick up a GoodRx coupon card.  - If you need your prescription sent electronically to a different pharmacy, notify our office through [facility] or by phone at [phone] option 4.     Si Usted Necesita Algo Despus de Su Visita  Tambin puede enviarnos un mensaje a travs de Clinical cytogeneticist. Por lo general respondemos a los mensajes de MyChart en el transcurso de 1 a 2 das hbiles.  Para renovar recetas, por favor pida a su farmacia que se ponga en contacto con nuestra oficina. Randi lakes de fax es [employer] [phone].  Si tiene un asunto urgente cuando la clnica est cerrada y que no puede esperar hasta el siguiente da hbil, puede llamar/localizar a su doctor(a) al nmero que aparece a continuacin.   Por favor, tenga en cuenta que aunque hacemos todo lo posible para estar disponibles para asuntos urgentes fuera del horario de [place], no estamos disponibles las 24 horas del da, los 7 [address]  [address] de la [place].   Si tiene un problema urgente y no puede comunicarse con  nosotros, puede optar por buscar atencin mdica  en el consultorio de su doctor(a), en una clnica privada, en un centro de atencin urgente o en una sala de emergencias.  Si tiene Engineer, drilling, por favor llame inmediatamente al 911 o vaya a la sala de emergencias.  Nmeros de bper  - Dr. Hester: [phone]  - Dra. Jackquline: [phone]  - Dr. Claudene: [phone]  - Dra. Kitts: [phone]  En caso de inclemencias del [place], por favor llame a nuestra lnea principal al [phone] para una actualizacin sobre el estado de cualquier retraso o cierre.  Consejos para la medicacin en dermatologa: Por favor, guarde las cajas en las que vienen los medicamentos de uso tpico para ayudarle a seguir las instrucciones sobre dnde y cmo usarlos. Las farmacias generalmente imprimen las instrucciones del medicamento slo en las cajas y no directamente en los tubos del [place].   Si su medicamento es muy caro, por favor, pngase en contacto con landry rieger llamando al [phone] y presione la opcin 4 o envenos un mensaje a travs de Clinical cytogeneticist.   No podemos decirle cul ser su copago por los medicamentos por adelantado ya que esto es diferente dependiendo de la cobertura de su seguro. Sin embargo, es posible que podamos encontrar un medicamento sustituto a Audiological scientist un formulario para que el seguro cubra el medicamento que se considera necesario.   Si se requiere una autorizacin previa para que su compaa de seguros malta su medicamento, por favor permtanos de 1 a 2 das hbiles para completar este proceso.  Los precios de los medicamentos varan con frecuencia dependiendo del Environmental consultant de dnde se surte la receta y alguna farmacias pueden ofrecer precios ms baratos.  El sitio web www.goodrx.com tiene cupones para medicamentos de [facility]  farmacias. Los precios aqu no tienen en cuenta lo que podra costar con la ayuda del seguro (puede ser ms barato con su  seguro), pero el sitio web puede darle el precio si no utiliz Tourist information centre manager.  - Puede imprimir el cupn correspondiente y llevarlo con su receta a la farmacia.  - Tambin puede pasar por nuestra oficina durante el horario de atencin regular y Education officer, museum una tarjeta de cupones de GoodRx.  - Si necesita que su receta se enve electrnicamente a una farmacia diferente, informe a nuestra oficina a travs de MyChart de [place] o por telfono llamando al [phone] y presione la opcin 4.

## 2024-07-19 NOTE — Progress Notes (Deleted)
 Follow-Up Visit   Subjective  Krista Ferguson is a 48 y.o. female who presents for the following: Skin Cancer Screening and Full Body Skin Exam Hx of alopecia, Spot at left chest , does drain and doesn go away, spot at left flank /bra area  Feels that is raised more and and rubs on clothing   The patient presents for Total-Body Skin Exam (TBSE) for skin cancer screening and mole check. The patient has spots, moles and lesions to be evaluated, some may be new or changing and the patient may have concern these could be cancer.    The following portions of the chart were reviewed this encounter and updated as appropriate: medications, allergies, medical history  Review of Systems:  No other skin or systemic complaints except as noted in HPI or Assessment and Plan.  Objective  Well appearing patient in no apparent distress; mood and affect are within normal limits.  A full examination was performed including scalp, head, eyes, ears, nose, lips, neck, chest, axillae, abdomen, back, buttocks, bilateral upper extremities, bilateral lower extremities, hands, feet, fingers, toes, fingernails, and toenails. All findings within normal limits unless otherwise noted below.   Relevant physical exam findings are noted in the Assessment and Plan.    Assessment & Plan   SKIN CANCER SCREENING PERFORMED TODAY.  EPIDERMAL INCLUSION CYST Exam: 6 mm firm papule with punctum left breast   Cyst with symptoms and/or recent change.  Discussed surgical excision to remove, including resulting scar and possible recurrence.  Patient will schedule for surgery. Pre-op information given.   ACTINIC DAMAGE - Chronic condition, secondary to cumulative UV/sun exposure - diffuse scaly erythematous macules with underlying dyspigmentation - Recommend daily broad spectrum sunscreen SPF 30+ to sun-exposed areas, reapply every 2 hours as needed.  - Staying in the shade or wearing long sleeves, sun glasses  (UVA+UVB protection) and wide brim hats (4-inch brim around the entire circumference of the hat) are also recommended for sun protection.  - Call for new or changing lesions.  LENTIGINES, SEBORRHEIC KERATOSES, HEMANGIOMAS - Benign normal skin lesions - Benign-appearing - Call for any changes  Acrochordons (Skin Tags)  - Fleshy, skin-colored pedunculated papules - Benign appearing.  - Observe. - If desired, they can be removed with an in office procedure that is not covered by insurance. - Please call the clinic if you notice any new or changing lesions.   MELANOCYTIC NEVI - Tan-brown and/or pink-flesh-colored symmetric macules and papules - Benign appearing on exam today - Observation - Call clinic for new or changing moles - Recommend daily use of broad spectrum spf 30+ sunscreen to sun-exposed areas.   - Right 2nd Plantar Toe: 4.88mm brown macule  -Left upper pretibia: 3.49mm brown macule slight irreg border    -Right Upper Anterior Thigh: 4.61mm medium dark  thin brown papule   -Right medial foot 6 mm speckled tan papule  -Left mid pretibia 4.0 mm medium dark brown macule  - 7 mm brown papule at right axilla   DERMATOFIBROMA Exam: Firm pink/brown papulenodule with dimple sign at right knee Treatment Plan: A dermatofibroma is a benign growth possibly related to trauma, such as an insect bite, cut from shaving, or inflamed acne-type bump.  Treatment options to remove include shave or excision with resulting scar and risk of recurrence.  Since benign-appearing and not bothersome, will observe for now.    PSORIASIS Exam: Mild scale at Scalp  and erythema and scale at postauricular b/l    Chronic  condition with duration or expected duration over one year. Currently well-controlled.    patient has joint pain on occasion, usually in the winter   Psoriasis is a chronic non-curable, but treatable genetic/hereditary disease that may have other systemic features affecting other  organ systems such as joints (Psoriatic Arthritis). It is associated with an increased risk of inflammatory bowel disease, heart disease, non-alcoholic fatty liver disease, and depression.  Treatments include light and laser treatments; topical medications; and systemic medications including oral and injectables.   Treatment Plan: Continue Vtama  1 % cream apply topically to aa ears daily  Continue Ketoconazole  2 % shampoo - apply topically 2 times a week. Wash scalp 2 times weekly let sit 5 minutes and rinse out.       ANDROGENETIC ALOPECIA (FEMALE PATTERN HAIR LOSS) Exam: Diffuse thinning of the crown and widening of the midline part with retention of the frontal hairline, Photos compared with 11/2021 and more hair growth noted.   Patient has been on medication for 2 to 3 years   Chronic condition with duration or expected duration over one year. Currently well-controlled.    Female Androgenic Alopecia is a chronic condition related to genetics and/or hormonal changes.  In women androgenetic alopecia is commonly associated with menopause but may occur any time after puberty.  It causes hair thinning primarily on the crown with widening of the part and temporal hairline recession.  Can use OTC Rogaine  (minoxidil ) 5% solution/foam as directed.  Oral treatments in female patients who have no contraindication may include : - Low dose oral minoxidil  1.25 - 5mg  daily - Spironolactone 50 - 100mg  bid - Finasteride  2.5 - 5 mg daily Adjunctive therapies include: - Low Level Laser Light Therapy (LLLT) - Platelet-rich plasma injections (PRP) - Hair Transplants or scalp reduction    Treatment Plan: Today bp is 113/73 67  Continue Minoxidil  2.5mg ,1 po qd Cont Finasteride  5mg  1 po qd   Long term medication management.  Patient is using long term (months to years) prescription medication  to control their dermatologic condition.  These medications require periodic monitoring to evaluate for efficacy  and side effects and may require periodic laboratory monitoring.     FOLLICULITIS/Acne Exam: follicular pink papules on right buttocks, nose    Treatment Plan: -Clindamycin  lotion to affected area bid prn    Recommend OTC benzoyl peroxide cleanser, wash affected areas daily in shower, let sit several minutes prior to rinsing.  May bleach towels if not rinsed off completely.  Recommended brands include Panoxyl 4% Creamy Wash, CeraVe Acne Foaming Cream wash, or Cetaphil Gentle Clear Complexion-Clearing BPO Acne Cleanser.   HISTORY OF DYSPLASTIC NEVUS No evidence of recurrence today Recommend regular full body skin exams Recommend daily broad spectrum sunscreen SPF 30+ to sun-exposed areas, reapply every 2 hours as needed.  Call if any new or changing lesions are noted between office visits    Return in about 1 year (around 07/19/2025) for TBSE.  I, Eleanor Blush, CMA, am acting as scribe for Rexene Rattler, MD.   Documentation: I have reviewed the above documentation for accuracy and completeness, and I agree with the above.  Rexene Rattler, MD

## 2024-07-19 NOTE — Progress Notes (Signed)
 Follow-Up Visit   Subjective  Krista Ferguson is a 48 y.o. female who presents for the following: Skin Cancer Screening and Full Body Skin Exam Hx of dysplastic nevus, hx of androgenetic alopecia - minoxidil  and finasteride  taking 1 whole tab of both daily for 2-3 years- feels has improved, hx of folliculitis/ acne at nose and buttocks - clindamycin  and otc bpos - is controlled. Hx of psoriasis at scalp and ears- ketoconazole  shampoo and vtama  cream- mild flare today - would like refills  Reports a spot that rubs on clothing at left flank - feels has gotten thicker- also reports spot that drains, oozes and will not go away at left breast that is bothersome.   The patient presents for Total-Body Skin Exam (TBSE) for skin cancer screening and mole check. The patient has spots, moles and lesions to be evaluated, some may be new or changing and the patient may have concern these could be cancer.    The following portions of the chart were reviewed this encounter and updated as appropriate: medications, allergies, medical history  Review of Systems:  No other skin or systemic complaints except as noted in HPI or Assessment and Plan.  Objective  Well appearing patient in no apparent distress; mood and affect are within normal limits.  A full examination was performed including scalp, head, eyes, ears, nose, lips, neck, chest, axillae, abdomen, back, buttocks, bilateral upper extremities, bilateral lower extremities, hands, feet, fingers, toes, fingernails, and toenails. All findings within normal limits unless otherwise noted below.   Relevant physical exam findings are noted in the Assessment and Plan.  Left Flank 8 mm firm papule   Assessment & Plan   EPIDERMAL INCLUSION CYST Exam: 6 mm firm papule with punctum left breast    Cyst with symptoms and/or recent change.  Discussed surgical excision to remove, including resulting scar and possible recurrence.  Patient will  schedule for surgery. Pre-op information given.   SKIN CANCER SCREENING PERFORMED TODAY.  ACTINIC DAMAGE - Chronic condition, secondary to cumulative UV/sun exposure - diffuse scaly erythematous macules with underlying dyspigmentation - Recommend daily broad spectrum sunscreen SPF 30+ to sun-exposed areas, reapply every 2 hours as needed.  - Staying in the shade or wearing long sleeves, sun glasses (UVA+UVB protection) and wide brim hats (4-inch brim around the entire circumference of the hat) are also recommended for sun protection.  - Call for new or changing lesions.  LENTIGINES, SEBORRHEIC KERATOSES, HEMANGIOMAS - Benign normal skin lesions - Benign-appearing - Call for any changes  MELANOCYTIC NEVI - Tan-brown and/or pink-flesh-colored symmetric macules and papules - Right 2nd Plantar Toe 4 mm brown macule  - Left upper pretibia 3.109mm brown macule slight irreg border - Right Upper Anterior Thigh 4 mm medium dark thin brown papule  - Right medial foot 6 mm speckled tan papule  - Left mid pretibia 4 mm medium dark brown macule  - right axilla 7 mm brown papule  - Benign appearing on exam today - Observation - Call clinic for new or changing moles - Recommend daily use of broad spectrum spf 30+ sunscreen to sun-exposed areas.    DERMATOFIBROMA Exam: Firm pink/brown papulenodule with dimple sign at right knee  Treatment Plan: A dermatofibroma is a benign growth possibly related to trauma, such as an insect bite, cut from shaving, or inflamed acne-type bump.  Treatment options to remove include shave or excision with resulting scar and risk of recurrence.  Since benign-appearing and not bothersome, will observe for  now.      PSORIASIS Exam: Mild diffuse scaling at scalp and erythema and scale at postauricular b/l    Chronic and persistent condition with duration or expected duration over one year. Condition is symptomatic/ bothersome to patient. Not currently at goal. Pt has  had recent flare with change in weather.  Will start using Rx meds again.   patient has joint pain on occasion, usually in the winter   Psoriasis is a chronic non-curable, but treatable genetic/hereditary disease that may have other systemic features affecting other organ systems such as joints (Psoriatic Arthritis). It is associated with an increased risk of inflammatory bowel disease, heart disease, non-alcoholic fatty liver disease, and depression.  Treatments include light and laser treatments; topical medications; and systemic medications including oral and injectables.   Treatment Plan: Continue Vtama  1 % cream apply topically to aa ears daily  Continue Ketoconazole  2 % shampoo - apply topically 2 times a week. Wash scalp 2 times weekly let sit 5 minutes and rinse out.       ANDROGENETIC ALOPECIA (FEMALE PATTERN HAIR LOSS) Exam: Diffuse thinning of the crown and widening of the midline part with retention of the frontal hairline, Photos compared with 11/2021 and more hair growth noted. BP 113/73, P 67   Chronic and persistent condition with duration or expected duration over one year. Condition is improving with treatment but not currently at goal. Patient has been on minoxidil  since 11/2021, finasteride  started before that initially prescribed by PCP.   Female Androgenic Alopecia is a chronic condition related to genetics and/or hormonal changes.  In women androgenetic alopecia is commonly associated with menopause but may occur any time after puberty.  It causes hair thinning primarily on the crown with widening of the part and temporal hairline recession.  Can use OTC Rogaine  (minoxidil ) 5% solution/foam as directed.  Oral treatments in female patients who have no contraindication may include : - Low dose oral minoxidil  1.25 - 5mg  daily - Spironolactone 50 - 100mg  bid - Finasteride  2.5 - 5 mg daily Adjunctive therapies include: - Low Level Laser Light Therapy (LLLT) - Platelet-rich  plasma injections (PRP) - Hair Transplants or scalp reduction    Treatment Plan: Continue Minoxidil  2.5mg ,1 po qd Cont Finasteride  5mg  1 po qd   Doses of oral minoxidil  for hair loss are considered 'low dose'. This is because the doses used for hair loss are much lower than the doses which are used for conditions such as high blood pressure (hypertension). The doses used for hypertension are 10-40mg  per day.  Side effects are uncommon at the low doses (up to 2.5 mg/day) used to treat hair loss. Potential side effects, more commonly seen at higher doses, include: Increase in hair growth (hypertrichosis) elsewhere on face and body Temporary hair shedding upon starting medication which may last up to 4 weeks Ankle swelling, fluid retention, rapid weight gain more than 5 pounds Low blood pressure and feeling lightheaded or dizzy when standing up quickly Fast or irregular heartbeat Headaches   Counseled that finasteride  can decrease libido (sexual drive). Advised it should not be taken by pregnant women or women who could become pregnant. Advised not to donate blood products while taking this medication. Advised if medication is stopped, they may lose the hair the medication has been helping to grow.   Long term medication management.  Patient is using long term (months to years) prescription medication  to control their dermatologic condition.  These medications require periodic monitoring to evaluate  for efficacy and side effects and may require periodic laboratory monitoring.     FOLLICULITIS/Acne Exam: clear today at nose, buttocks  Chronic condition with duration or expected duration over one year. Currently well-controlled.  Treatment Plan: Continue Clindamycin  lotion to affected area bid prn    Continue OTC benzoyl peroxide cleanser, wash affected areas daily in shower, let sit several minutes prior to rinsing.  May bleach towels if not rinsed off completely.  Recommended brands include  Panoxyl 4% Creamy Wash, CeraVe Acne Foaming Cream wash, or Cetaphil Gentle Clear Complexion-Clearing BPO Acne Cleanser.     HISTORY OF DYSPLASTIC NEVUS No evidence of recurrence today Recommend regular full body skin exams Recommend daily broad spectrum sunscreen SPF 30+ to sun-exposed areas, reapply every 2 hours as needed.  Call if any new or changing lesions are noted between office visits   NEOPLASM OF UNCERTAIN BEHAVIOR Left Flank Epidermal / dermal shaving  Lesion diameter (cm):  0.8 Informed consent: discussed and consent obtained   Patient was prepped and draped in usual sterile fashion: Area prepped with alcohol. Anesthesia: the lesion was anesthetized in a standard fashion   Anesthetic:  1% lidocaine  w/ epinephrine 1-100,000 buffered w/ 8.4% NaHCO3 Instrument used: flexible razor blade   Hemostasis achieved with: pressure, aluminum chloride and electrodesiccation   Outcome: patient tolerated procedure well   Post-procedure details: wound care instructions given   Post-procedure details comment:  Ointment and small bandage applied.   Specimen 1 - Surgical pathology Differential Diagnosis: Irritated nevus r/o dysplasia   Check Margins: yes Irritated nevus r/o dysplasia  PSORIASIS   Related Medications ketoconazole  (NIZORAL ) 2 % shampoo Apply 1 Application topically 2 (two) times a week. Wash scalp 2 times weekly, let sit 5 minutes and rinse out Tapinarof  (VTAMA ) 1 % CREA Apply 1 application  topically in the morning and at bedtime. Apply to affected area ears as needed for psoriasis. ANDROGENETIC ALOPECIA   Related Medications finasteride  (PROSCAR ) 5 MG tablet Take 1 tablet (5 mg total) by mouth daily. minoxidil  (LONITEN ) 2.5 MG tablet Take 1 tablet (2.5 mg total) by mouth daily. FOLLICULITIS   Related Medications clindamycin  (CLEOCIN -T) 1 % lotion Apply to affected area once to twice daily Return in about 1 year (around 07/19/2025) for TBSE.  I, Eleanor Blush, CMA, am acting as scribe for Rexene Rattler, MD.   Documentation: I have reviewed the above documentation for accuracy and completeness, and I agree with the above.  Rexene Rattler, MD

## 2024-07-21 LAB — SURGICAL PATHOLOGY

## 2024-07-27 ENCOUNTER — Ambulatory Visit: Payer: Self-pay | Admitting: Dermatology

## 2024-07-27 ENCOUNTER — Encounter: Payer: Self-pay | Admitting: Dermatology

## 2024-07-27 NOTE — Telephone Encounter (Signed)
 Advised pt of bx results/sh ?

## 2024-07-27 NOTE — Telephone Encounter (Signed)
-----   Message from Rexene Rattler sent at 07/27/2024 10:57 AM EDT ----- 1. Skin, left flank :       DYSPLASTIC COMPOUND NEVUS WITH MILD ATYPIA, DEEP MARGIN INVOLVED   Mildly atypical mole, observation - please call patient ----- Message ----- From: Interface, [facility] Sent: 07/21/2024   6:10 PM EDT To: Rexene Rattler, MD

## 2024-08-04 ENCOUNTER — Ambulatory Visit (INDEPENDENT_AMBULATORY_CARE_PROVIDER_SITE_OTHER): Admitting: Dermatology

## 2024-08-04 DIAGNOSIS — D485 Neoplasm of uncertain behavior of skin: Secondary | ICD-10-CM

## 2024-08-04 DIAGNOSIS — D492 Neoplasm of unspecified behavior of bone, soft tissue, and skin: Secondary | ICD-10-CM

## 2024-08-04 DIAGNOSIS — L720 Epidermal cyst: Secondary | ICD-10-CM

## 2024-08-04 NOTE — Progress Notes (Signed)
   Follow-Up Visit   Subjective  Krista Ferguson is a 48 y.o. female who presents for the following: Cyst vs other L breast, excision today, gets bigger and drains smelly material   The following portions of the chart were reviewed this encounter and updated as appropriate: medications, allergies, medical history  Review of Systems:  No other skin or systemic complaints except as noted in HPI or Assessment and Plan.  Objective  Well appearing patient in no apparent distress; mood and affect are within normal limits.   A focused examination was performed of the following areas: chest  Relevant exam findings are noted in the Assessment and Plan.  Left Breast 1 cm firm SQ nodule  Assessment & Plan     NEOPLASM OF SKIN Left Breast Skin excision  Excision method:  elliptical Lesion length (cm):  1 Lesion width (cm):  1 Margin per side (cm):  0.1 Total excision diameter (cm):  1.2 Informed consent: discussed and consent obtained   Timeout: patient name, date of birth, surgical site, and procedure verified   Procedure prep:  Patient was prepped and draped in usual sterile fashion Prep type:  Povidone-iodine Anesthesia: the lesion was anesthetized in a standard fashion   Anesthetic:  1% lidocaine  w/ epinephrine 1-100,000 buffered w/ 8.4% NaHCO3 (9cc lido w/ epi) Instrument used comment:  #15c blade Hemostasis achieved with: pressure and electrodesiccation   Outcome: patient tolerated procedure well with no complications    Skin repair Complexity:  Intermediate Final length (cm):  1.5 Informed consent: discussed and consent obtained   Reason for type of repair: reduce tension to allow closure, reduce the risk of dehiscence, infection, and necrosis, reduce subcutaneous dead space and avoid a hematoma, preserve normal anatomical and functional relationships and enhance both functionality and cosmetic results   Undermining: edges undermined   Subcutaneous layers (deep  stitches):  Suture size:  3-0 Suture type: Vicryl (polyglactin 910)   Subcutaneous suture technique: inverted dermal. Fine/surface layer approximation (top stitches):  Suture size:  4-0 Suture type: nylon   Stitches: simple interrupted   Suture removal (days):  7 Hemostasis achieved with: suture Outcome: patient tolerated procedure well with no complications   Post-procedure details: sterile dressing applied and wound care instructions given   Dressing type: pressure dressing (Mupirocin ointment)    Specimen 1 - Surgical pathology Differential Diagnosis: Cyst vs other  Check Margins: No Cystic pap 1.0cm  Return in about 1 week (around 08/11/2024) for suture removal.  I, Sonya Hupman, RMA, am acting as scribe for Rexene Rattler, MD .   Documentation: I have reviewed the above documentation for accuracy and completeness, and I agree with the above.  Rexene Rattler, MD

## 2024-08-04 NOTE — Patient Instructions (Signed)
 Wound Care Instructions  On the day following your surgery, you should begin doing daily dressing changes: Remove the old dressing and discard it. Cleanse the wound gently with tap water. This may be done in the shower or by placing a wet gauze pad directly on the wound and letting it soak for several minutes. It is important to gently remove any dried blood from the wound in order to encourage healing. This may be done by gently rolling a moistened Q-tip on the dried blood. Do not pick at the wound. If the wound should start to bleed, continue cleaning the wound, then place a moist gauze pad on the wound and hold pressure for a few minutes.  Make sure you then dry the skin surrounding the wound completely or the tape will not stick to the skin. Do not use cotton balls on the wound. After the wound is clean and dry, apply the ointment gently with a Q-tip. Cut a non-stick pad to fit the size of the wound. Lay the pad flush to the wound. If the wound is draining, you may want to reinforce it with a small amount of gauze on top of the non-stick pad for a little added compression to the area. Use the tape to seal the area completely. Select from the following with respect to your individual situation: If your wound has been stitched closed: continue the above steps 1-8 at least daily until your sutures are removed. If your wound has been left open to heal: continue steps 1-8 at least daily for the first 3-4 weeks. We would like for you to take a few extra precautions for at least the next week. Sleep with your head elevated on pillows if our wound is on your head. Do not bend over or lift heavy items to reduce the chance of elevated blood pressure to the wound Do not participate in particularly strenuous activities.   Below is a list of dressing supplies you might need.  Cotton-tipped applicators - Q-tips Gauze pads (2x2 and/or 4x4) - All-Purpose Sponges Non-stick dressing material - Telfa Tape -  Paper or Hypafix New and clean tube of petroleum jelly - Vaseline    Comments on Post-Operative Period Slight swelling and redness often appear around the wound. This is normal and will disappear within several days following the surgery. The healing wound will drain a brownish-red-yellow discharge during healing. This is a normal phase of wound healing. As the wound begins to heal, the drainage may increase in amount. Again, this drainage is normal. Notify us  if the drainage becomes persistently bloody, excessively swollen, or intensely painful or develops a foul odor or red streaks.  If you should experience mild discomfort during the healing phase, you may take an aspirin-free medication such as Tylenol  (acetaminophen ). Notify us  if the discomfort is severe or persistent. Avoid alcoholic beverages when taking pain medicine.  In Case of Wound Hemorrhage A wound hemorrhage is when the bandage suddenly becomes soaked with bright red blood and flows profusely. If this happens, sit down or lie down with your head elevated. If the wound has a dressing on it, do not remove the dressing. Apply pressure to the existing gauze. If the wound is not covered, use a gauze pad to apply pressure and continue applying the pressure for 20 minutes without peeking. DO NOT COVER THE WOUND WITH A LARGE TOWEL OR WASH CLOTH. Release your hand from the wound site but do not remove the dressing. If the bleeding has stopped,  gently clean around the wound. Leave the dressing in place for 24 hours if possible. This wait time allows the blood vessels to close off so that you do not spark a new round of bleeding by disrupting the newly clotted blood vessels with an immediate dressing change. If the bleeding does not subside, continue to hold pressure. If matters are out of your control, contact an After Hours clinic or go to the Emergency Room.     Due to recent changes in healthcare laws, you may see results of your pathology  and/or laboratory studies on MyChart before the doctors have had a chance to review them. We understand that in some cases there may be results that are confusing or concerning to you. Please understand that not all results are received at the same time and often the doctors may need to interpret multiple results in order to provide you with the best plan of care or course of treatment. Therefore, we ask that you please give us  2 business days to thoroughly review all your results before contacting the office for clarification. Should we see a critical lab result, you will be contacted sooner.   If You Need Anything After Your Visit  If you have any questions or concerns for your doctor, please call our main line at [phone] and press option 4 to reach your doctor's medical assistant. If no one answers, please leave a voicemail as directed and we will return your call as soon as possible. Messages left after 4 pm will be answered the following business day.   You may also send us  a message via MyChart. We typically respond to MyChart messages within 1-2 business days.  For prescription refills, please ask your pharmacy to contact our office. Our fax number is [phone].  If you have an urgent issue when the clinic is closed that cannot wait until the next business day, you can page your doctor at the number below.    Please note that while we do our best to be available for urgent issues outside of office hours, we are not available 24/7.   If you have an urgent issue and are unable to reach us , you may choose to seek medical care at your doctor's office, retail clinic, urgent care center, or emergency room.  If you have a medical emergency, please immediately call 911 or go to the emergency department.  Pager Numbers  - Dr. Hester: [phone]  - Dr. Jackquline: [phone]  - Dr. Claudene: [phone]   - Dr. Raymund: [phone]  In the event of inclement weather, please call our  main line at [phone] for an update on the status of any delays or closures.  Dermatology Medication Tips: Please keep the boxes that topical medications come in in order to help keep track of the instructions about where and how to use these. Pharmacies typically print the medication instructions only on the boxes and not directly on the medication tubes.   If your medication is too expensive, please contact our office at [phone] option 4 or send us  a message through MyChart.   We are unable to tell what your co-pay for medications will be in advance as this is different depending on your insurance coverage. However, we may be able to find a substitute medication at lower cost or fill out paperwork to get insurance to cover a needed medication.   If a prior authorization is required to get your medication covered by your insurance company, please allow us   1-2 business days to complete this process.  Drug prices often vary depending on where the prescription is filled and some pharmacies may offer cheaper prices.  The website www.goodrx.com contains coupons for medications through different pharmacies. The prices here do not account for what the cost may be with help from insurance (it may be cheaper with your insurance), but the website can give you the price if you did not use any insurance.  - You can print the associated coupon and take it with your prescription to the pharmacy.  - You may also stop by our office during regular business hours and pick up a GoodRx coupon card.  - If you need your prescription sent electronically to a different pharmacy, notify our office through [facility] or by phone at [phone] option 4.     Si Usted Necesita Algo Despus de Su Visita  Tambin puede enviarnos un mensaje a travs de Clinical cytogeneticist. Por lo general respondemos a los mensajes de MyChart en el transcurso de 1 a 2 das hbiles.  Para renovar recetas, por favor pida a su  farmacia que se ponga en contacto con nuestra oficina. Randi lakes de fax es [place] [phone].  Si tiene un asunto urgente cuando la clnica est cerrada y que no puede esperar hasta el siguiente da hbil, puede llamar/localizar a su doctor(a) al nmero que aparece a continuacin.   Por favor, tenga en cuenta que aunque hacemos todo lo posible para estar disponibles para asuntos urgentes fuera del horario de [place], no estamos disponibles las 24 horas del da, los 7 [address]  [address] de la [place].   Si tiene un problema urgente y no puede comunicarse con nosotros, puede optar por buscar atencin mdica  en el consultorio de su doctor(a), en una clnica privada, en un centro de atencin urgente o en una sala de emergencias.  Si tiene Engineer, drilling, por favor llame inmediatamente al 911 o vaya a la sala de emergencias.  Nmeros de bper  - Dr. Hester: [phone]  - Dra. Jackquline: [phone]  - Dr. Claudene: [phone]  - Dra. Kitts: [phone]  En caso de inclemencias del [place], por favor llame a nuestra lnea principal al [phone] para una actualizacin sobre el estado de cualquier retraso o cierre.  Consejos para la medicacin en dermatologa: Por favor, guarde las cajas en las que vienen los medicamentos de uso tpico para ayudarle a seguir las instrucciones sobre dnde y cmo usarlos. Las farmacias generalmente imprimen las instrucciones del medicamento slo en las cajas y no directamente en los tubos del [place].   Si su medicamento es muy caro, por favor, pngase en contacto con landry rieger llamando al [phone] y presione la opcin 4 o envenos un mensaje a travs de Clinical cytogeneticist.   No podemos decirle cul ser su copago por los medicamentos por adelantado ya que esto es diferente dependiendo de la cobertura de su seguro. Sin embargo, es posible que podamos encontrar un medicamento sustituto a Audiological scientist un formulario para que el seguro cubra el medicamento  que se considera necesario.   Si se requiere una autorizacin previa para que su compaa de seguros malta su medicamento, por favor permtanos de 1 a 2 das hbiles para completar este proceso.  Los precios de los medicamentos varan con frecuencia dependiendo del Environmental consultant de dnde se surte la receta y alguna farmacias pueden ofrecer precios ms baratos.  El sitio web www.goodrx.com tiene cupones para medicamentos de Health and safety inspector. Los precios aqu no tienen en  cuenta lo que podra costar con la ayuda del seguro (puede ser ms barato con su seguro), pero el sitio web puede darle el precio si no Visual merchandiser.  - Puede imprimir el cupn correspondiente y llevarlo con su receta a la farmacia.  - Tambin puede pasar por nuestra oficina durante el horario de atencin regular y Education officer, museum una tarjeta de cupones de GoodRx.  - Si necesita que su receta se enve electrnicamente a una farmacia diferente, informe a nuestra oficina a travs de MyChart de [place] o por telfono llamando al [phone] y presione la opcin 4.

## 2024-08-05 ENCOUNTER — Telehealth: Payer: Self-pay

## 2024-08-05 LAB — SURGICAL PATHOLOGY

## 2024-08-05 NOTE — Telephone Encounter (Signed)
Patient doing well after yesterdays surgery./sh 

## 2024-08-09 ENCOUNTER — Ambulatory Visit: Payer: Self-pay | Admitting: Dermatology

## 2024-08-11 ENCOUNTER — Ambulatory Visit: Admitting: Dermatology

## 2024-08-11 DIAGNOSIS — L729 Follicular cyst of the skin and subcutaneous tissue, unspecified: Secondary | ICD-10-CM

## 2024-08-11 DIAGNOSIS — Z48817 Encounter for surgical aftercare following surgery on the skin and subcutaneous tissue: Secondary | ICD-10-CM

## 2024-08-11 NOTE — Progress Notes (Signed)
   Follow-Up Visit   Subjective  Krista Ferguson is a 48 y.o. female who presents for the following: Suture removal  Pathology showed EPIDERMAL INCLUSION CYST   The following portions of the chart were reviewed this encounter and updated as appropriate: medications, allergies, medical history  Review of Systems:  No other skin or systemic complaints except as noted in HPI or Assessment and Plan.  Objective  Well appearing patient in no apparent distress; mood and affect are within normal limits.  Areas Examined: left breast  Relevant physical exam findings are noted in the Assessment and Plan.    Assessment & Plan    Encounter for Removal of Sutures - Incision site is clean, dry and intact. - Wound cleansed, sutures removed, wound cleansed and steri strips applied.  - Discussed pathology results showing EPIDERMAL INCLUSION CYST  - Patient advised to keep steri-strips dry until they fall off. - Scars remodel for a full year. - Once steri-strips fall off, patient can apply over-the-counter silicone scar cream once to twice a day to help with scar remodeling if desired. - Patient advised to call with any concerns or if they notice any new or changing lesions.  Return for Appointment as scheduled.  I, Emerick Ege, CMA am acting as scribe for Rexene Rattler, MD.   Documentation: I have reviewed the above documentation for accuracy and completeness, and I agree with the above.  Rexene Rattler, MD

## 2024-08-11 NOTE — Patient Instructions (Signed)

## 2024-10-26 ENCOUNTER — Encounter: Payer: Self-pay | Admitting: Nurse Practitioner

## 2024-10-26 MED ORDER — ZEPBOUND 2.5 MG/0.5ML ~~LOC~~ SOAJ: 2.5000 mg | SUBCUTANEOUS | 2 refills | Status: DC

## 2024-10-27 ENCOUNTER — Telehealth: Payer: Self-pay

## 2024-10-27 MED ORDER — ZEPBOUND 2.5 MG/0.5ML ~~LOC~~ SOAJ: 2.5000 mg | SUBCUTANEOUS | 2 refills | Status: AC

## 2024-10-27 NOTE — Telephone Encounter (Signed)
 Copied from CRM #[id number]. Topic: Clinical - Prescription Issue >> Oct 27, 2024 11:35 AM Tanazia G wrote: Reason for CRM: Patient said her tirzepatide  (ZEPBOUND ) 2.5 MG/0.5ML Pen [facility]. Medication should be sent to:  [facility] [address] [facility] - [place], [place] - [address] [facility] [address] [place] [address] Phone: [phone] Fax: [phone] Hours: Not open 24 hours  A call back is not needed.

## 2024-11-11 ENCOUNTER — Ambulatory Visit: Admitting: Nurse Practitioner

## 2024-12-07 ENCOUNTER — Ambulatory Visit: Admitting: Nurse Practitioner

## 2024-12-17 ENCOUNTER — Encounter: Payer: Self-pay | Admitting: Nurse Practitioner

## 2024-12-17 ENCOUNTER — Ambulatory Visit: Admitting: Nurse Practitioner

## 2024-12-17 VITALS — BP 116/74 | HR 66 | Temp 98.1°F | Ht 63.75 in | Wt 138.0 lb

## 2024-12-17 DIAGNOSIS — I10 Essential (primary) hypertension: Secondary | ICD-10-CM | POA: Diagnosis not present

## 2024-12-17 DIAGNOSIS — E785 Hyperlipidemia, unspecified: Secondary | ICD-10-CM | POA: Diagnosis not present

## 2024-12-17 DIAGNOSIS — F419 Anxiety disorder, unspecified: Secondary | ICD-10-CM | POA: Diagnosis not present

## 2024-12-17 DIAGNOSIS — E559 Vitamin D deficiency, unspecified: Secondary | ICD-10-CM | POA: Diagnosis not present

## 2024-12-17 DIAGNOSIS — Z23 Encounter for immunization: Secondary | ICD-10-CM | POA: Diagnosis not present

## 2024-12-17 DIAGNOSIS — E538 Deficiency of other specified B group vitamins: Secondary | ICD-10-CM | POA: Diagnosis not present

## 2024-12-17 DIAGNOSIS — Z9884 Bariatric surgery status: Secondary | ICD-10-CM

## 2024-12-17 MED ORDER — HYDROCHLOROTHIAZIDE 12.5 MG PO CAPS: 12.5000 mg | ORAL_CAPSULE | Freq: Every day | ORAL | 1 refills | Status: AC

## 2024-12-17 MED ORDER — DULOXETINE HCL 20 MG PO CPEP: 20.0000 mg | ORAL_CAPSULE | Freq: Every day | ORAL | 1 refills | Status: AC

## 2024-12-17 NOTE — Progress Notes (Signed)
 "  Established Patient Office Visit  Subjective:  Patient ID: Krista Ferguson, female    DOB: Jan 23, 1976  Age: 49 y.o. MRN: [id number]  CC:  Chief Complaint  Patient presents with   Medical Management of Chronic Issues   Discussed the use of AI scribe software for clinical note transcription with the patient, who gave verbal consent to proceed.  History of Present Illness   History of Present Illness   Krista Ferguson is a 49 year old female who presents for a regular follow-up visit.  She is on Zepbound  2.5 mg for weight management and is maintaining a stable weight after some regain from prior excessive loss. She is satisfied with her current weight, and her BMI is 23.  She takes hydrochlorothiazide  for hypertension and says her blood pressure is well controlled. If she misses a dose she has swelling the next day.  She uses duloxetine  (Cymbalta ) for anxiety, which keeps symptoms controlled.  She takes omeprazole  (Prilosec) as needed for reflux, now about once a month, usually after eating spicy foods. Symptoms have decreased since her weight loss.  She continues minoxidil  and modafinil.  Her vitamin D  level was 27 ng/mL six months ago. She prefers B12 injections over oral supplements because she feels injections work better for her.  She had a partial hysterectomy about 12-15 years ago and no longer has a uterus or cervix.       Past Medical History:  Diagnosis Date   Arthritis    Psosoratic   Atypical mole    lower abd, lower back ?   Dysplastic nevus 07/19/2024   Left Flank, mild atypia   GERD (gastroesophageal reflux disease)     Past Surgical History:  Procedure Laterality Date   ABDOMINAL HYSTERECTOMY     Partial   CESAREAN SECTION  1995   CHOLECYSTECTOMY     COLONOSCOPY WITH PROPOFOL  N/A 05/03/2022   Procedure: COLONOSCOPY WITH PROPOFOL ;  Surgeon: Unk Corinn Skiff, MD;  Location: [facility] ENDOSCOPY;  Service: Gastroenterology;  Laterality:  N/A;   LAPAROSCOPIC GASTRIC SLEEVE RESECTION N/A 03/02/2018   Procedure: LAPAROSCOPIC GASTRIC SLEEVE RESECTION WITH HIATAL HERNIA REPAIR AND UPPER ENDOSCOPY;  Surgeon: Gladis Cough, MD;  Location: [facility] ORS;  Service: General;  Laterality: N/A;   SINUSOTOMY  2011    Family History  Problem Relation Age of Onset   Hypertension Other    Diabetes Other    Stroke Other    Cancer Other    Asthma Other    COPD Other    Breast cancer Maternal Aunt    Breast cancer Maternal Grandmother     Social History   Socioeconomic History   Marital status: Divorced    Spouse name: Not on file   Number of children: Not on file   Years of education: Not on file   Highest education level: Not on file  Occupational History   Not on file  Tobacco Use   Smoking status: Never   Smokeless tobacco: Never  Vaping Use   Vaping status: Never Used  Substance and Sexual Activity   Alcohol use: Yes    Comment: Rare-2 times a year   Drug use: Never   Sexual activity: Yes  Other Topics Concern   Not on file  Social History Narrative   Not on file   Social Drivers of Health   Tobacco Use: Low Risk (12/17/2024)   Patient History    Smoking Tobacco Use: Never    Smokeless Tobacco Use: Never  Passive Exposure: Not on file  Financial Resource Strain: Not on file  Food Insecurity: Not on file  Transportation Needs: Not on file  Physical Activity: Not on file  Stress: Not on file  Social Connections: Not on file  Intimate Partner Violence: Not on file  Depression (PHQ2-9): Low Risk (12/17/2024)   Depression (PHQ2-9)    PHQ-2 Score: 0  Alcohol Screen: Not on file  Housing: Not on file  Utilities: Not on file  Health Literacy: Not on file     Outpatient Medications Prior to Visit  Medication Sig Dispense Refill   Calcipotriene-Betameth Diprop 0.005-0.064 % FOAM Apply 1 application topically daily as needed (for psoriasis).      clindamycin  (CLEOCIN -T) 1 % lotion Apply to affected area once to  twice daily 60 mL 6   finasteride  (PROSCAR ) 5 MG tablet Take 1 tablet (5 mg total) by mouth daily. 90 tablet 3   ketoconazole  (NIZORAL ) 2 % shampoo Apply 1 Application topically 2 (two) times a week. Wash scalp 2 times weekly, let sit 5 minutes and rinse out 120 mL 6   minoxidil  (LONITEN ) 2.5 MG tablet Take 1 tablet (2.5 mg total) by mouth daily. 90 tablet 3   modafinil (PROVIGIL) 100 MG tablet Take 50 mg by mouth 2 (two) times daily. (Patient not taking: Reported on 07/09/2024)     omeprazole  (PRILOSEC) 20 MG capsule TAKE 1 CAPSULE BY MOUTH EVERY DAY (Patient not taking: Reported on 07/09/2024) 30 capsule 1   Tapinarof  (VTAMA ) 1 % CREA Apply 1 application  topically in the morning and at bedtime. Apply to affected area ears as needed for psoriasis. 60 g 5   tirzepatide  (ZEPBOUND ) 2.5 MG/0.5ML Pen Inject 2.5 mg into the skin once a week. (Patient not taking: Reported on 07/09/2024) 0.5 mL 5   tirzepatide  (ZEPBOUND ) 2.5 MG/0.5ML Pen Inject 2.5 mg into the skin once a week. 2 mL 2   DULoxetine  (CYMBALTA ) 20 MG capsule Take 1 capsule (20 mg total) by mouth daily. 90 capsule 1   hydrochlorothiazide  (MICROZIDE ) 12.5 MG capsule Take 1 capsule (12.5 mg total) by mouth daily. 90 capsule 1   No facility-administered medications prior to visit.    Allergies[1]  ROS Review of Systems Negative unless indicated in HPI.    Objective:    Physical Exam Constitutional:      Appearance: Normal appearance.  HENT:     Right Ear: Tympanic membrane normal.     Left Ear: Tympanic membrane normal.     Mouth/Throat:     Mouth: Mucous membranes are moist.  Eyes:     Conjunctiva/sclera: Conjunctivae normal.     Pupils: Pupils are equal, round, and reactive to light.  Cardiovascular:     Rate and Rhythm: Normal rate and regular rhythm.     Pulses: Normal pulses.     Heart sounds: Normal heart sounds.  Pulmonary:     Effort: Pulmonary effort is normal.     Breath sounds: Normal breath sounds.  Abdominal:      General: Bowel sounds are normal.     Palpations: Abdomen is soft. There is no mass.     Tenderness: There is no guarding.  Musculoskeletal:     Cervical back: Normal range of motion. No tenderness.     Right lower leg: No edema.     Left lower leg: No edema.  Skin:    General: Skin is warm.     Findings: No bruising.  Neurological:     General:  No focal deficit present.     Mental Status: She is alert and oriented to person, place, and time. Mental status is at baseline.  Psychiatric:        Mood and Affect: Mood normal.        Behavior: Behavior normal.        Thought Content: Thought content normal.        Judgment: Judgment normal.     BP 116/74   Pulse 66   Temp 98.1 F (36.7 C)   Ht 5' 3.75 (1.619 m)   Wt 138 lb (62.6 kg)   SpO2 99%   BMI 23.87 kg/m  Wt Readings from Last 3 Encounters:  12/17/24 138 lb (62.6 kg)  07/09/24 127 lb 3.2 oz (57.7 kg)  06/10/24 127 lb (57.6 kg)     Health Maintenance  Topic Date Due   Hepatitis B Vaccines 19-59 Average Risk (1 of 3 - 19+ 3-dose series) Never done   Influenza Vaccine  02/22/2025 (Originally 06/25/2024)   Cervical Cancer Screening (HPV/Pap Cotest)  03/23/2025 (Originally 09/09/2006)   Mammogram  05/21/2025   Colonoscopy  05/03/2032   HPV VACCINES (No Doses Required) Completed   Hepatitis C Screening  Completed   HIV Screening  Completed   Pneumococcal Vaccine  Aged Out   Meningococcal B Vaccine  Aged Out   DTaP/Tdap/Td  Discontinued   COVID-19 Vaccine  Discontinued       Topic Date Due   Hepatitis B Vaccines 19-59 Average Risk (1 of 3 - 19+ 3-dose series) Never done    Lab Results  Component Value Date   TSH 1.00 12/17/2024   Lab Results  Component Value Date   WBC 8.3 12/17/2024   HGB 14.9 12/17/2024   HCT 43.9 12/17/2024   MCV 94.0 12/17/2024   PLT 331 12/17/2024   Lab Results  Component Value Date   NA 141 12/17/2024   K 3.8 12/17/2024   CO2 34 (H) 12/17/2024   GLUCOSE 79 12/17/2024    BUN 11 12/17/2024   CREATININE 0.79 12/17/2024   BILITOT 0.6 12/17/2024   ALKPHOS 48 06/10/2024   AST 14 12/17/2024   ALT 11 12/17/2024   PROT 7.4 12/17/2024   ALBUMIN 4.6 06/10/2024   CALCIUM 9.3 12/17/2024   ANIONGAP 11 03/02/2018   EGFR 92 12/17/2024   GFR 101.02 06/10/2024   Lab Results  Component Value Date   CHOL 205 (H) 12/17/2024   Lab Results  Component Value Date   HDL 87 12/17/2024   Lab Results  Component Value Date   LDLCALC 101 (H) 12/17/2024   Lab Results  Component Value Date   TRIG 83 12/17/2024   Lab Results  Component Value Date   CHOLHDL 2.4 12/17/2024   No results found for: HGBA1C    Assessment & Plan:   Assessment & Plan Essential hypertension Patient BP  Vitals:   12/17/24 1322  BP: 116/74  - Continue hydrochlorothiazide  12.5 mg daily. - Will check labs as outlined  Orders:   hydrochlorothiazide  (MICROZIDE ) 12.5 MG capsule; Take 1 capsule (12.5 mg total) by mouth daily.   CBC with Differential/Platelet   Comprehensive metabolic panel with GFR  Hyperlipidemia, unspecified hyperlipidemia type Chronic stable. -Will check lipid panel. Orders:   Lipid panel  Anxiety Chronic stable on medication. -Continue Cymbalta  20 mg daily. -Will check labs as outlined. Orders:   DULoxetine  (CYMBALTA ) 20 MG capsule; Take 1 capsule (20 mg total) by mouth daily.   TSH  Need for  Tdap vaccination  Orders:   Tdap vaccine greater than or equal to 7yo IM  Vitamin D  deficiency Previous low vitamin D  level. -Will check vitamin D  level. Orders:   VITAMIN D  25 Hydroxy (Vit-D Deficiency, Fractures)  B12 deficiency Check vitamin B12 level Orders:   Vitamin B12  S/P laparoscopic sleeve gastrectomy Body mass index is 23.87 kg/m.  Stable with medication - Continue Zepbound  2.5 mg weekly.      Assessment & Plan   General health maintenance Mammogram and colonoscopy up to date. Tetanus vaccine given during the visit. -       Follow-up: Return in about 6 months (around 06/16/2025) for physical.   Chelsea Aurora, NP     [1]  Allergies Allergen Reactions   Elemental Sulfur Hives   Levaquin [Levofloxacin In D5w] Hives   Sulfa Antibiotics Hives and Other (See Comments)   Augmentin [Amoxicillin-Pot Clavulanate] Rash    Patient endorses full body rash which starts with her legs and progresses cephalic   "

## 2024-12-17 NOTE — Assessment & Plan Note (Addendum)
 Chronic stable on medication. -Continue Cymbalta  20 mg daily. -Will check labs as outlined. Orders:   DULoxetine  (CYMBALTA ) 20 MG capsule; Take 1 capsule (20 mg total) by mouth daily.   TSH

## 2024-12-17 NOTE — Assessment & Plan Note (Addendum)
 Chronic stable. -Will check lipid panel. Orders:   Lipid panel

## 2024-12-17 NOTE — Assessment & Plan Note (Addendum)
 Patient BP  Vitals:   12/17/24 1322  BP: 116/74  - Continue hydrochlorothiazide  12.5 mg daily. - Will check labs as outlined  Orders:   hydrochlorothiazide  (MICROZIDE ) 12.5 MG capsule; Take 1 capsule (12.5 mg total) by mouth daily.   CBC with Differential/Platelet   Comprehensive metabolic panel with GFR

## 2024-12-20 ENCOUNTER — Ambulatory Visit: Payer: Self-pay | Admitting: Nurse Practitioner

## 2024-12-20 DIAGNOSIS — E559 Vitamin D deficiency, unspecified: Secondary | ICD-10-CM

## 2024-12-21 LAB — CBC WITH DIFFERENTIAL/PLATELET
Absolute Lymphocytes: 3237 {cells}/uL (ref 850–3900)
Absolute Monocytes: 498 {cells}/uL (ref 200–950)
Basophils Absolute: 91 {cells}/uL (ref 0–200)
Basophils Relative: 1.1 %
Eosinophils Absolute: 108 {cells}/uL (ref 15–500)
Eosinophils Relative: 1.3 %
HCT: 43.9 % (ref 35.9–46.0)
Hemoglobin: 14.9 g/dL (ref 11.7–15.5)
MCH: 31.9 pg (ref 27.0–33.0)
MCHC: 33.9 g/dL (ref 31.6–35.4)
MCV: 94 fL (ref 81.4–101.7)
MPV: 10.2 fL (ref 7.5–12.5)
Monocytes Relative: 6 %
Neutro Abs: 4366 {cells}/uL (ref 1500–7800)
Neutrophils Relative %: 52.6 %
Platelets: 331 10*3/uL (ref 140–400)
RBC: 4.67 Million/uL (ref 3.80–5.10)
RDW: 11.7 % (ref 11.0–15.0)
Total Lymphocyte: 39 %
WBC: 8.3 10*3/uL (ref 3.8–10.8)

## 2024-12-21 LAB — TSH: TSH: 1 m[IU]/L

## 2024-12-21 LAB — LIPID PANEL
Cholesterol: 205 mg/dL — ABNORMAL HIGH
HDL: 87 mg/dL
LDL Cholesterol (Calc): 101 mg/dL — ABNORMAL HIGH
Non-HDL Cholesterol (Calc): 118 mg/dL
Total CHOL/HDL Ratio: 2.4 (calc)
Triglycerides: 83 mg/dL

## 2024-12-21 LAB — COMPREHENSIVE METABOLIC PANEL WITH GFR
AG Ratio: 1.6 (calc) (ref 1.0–2.5)
ALT: 11 U/L (ref 6–29)
AST: 14 U/L (ref 10–35)
Albumin: 4.6 g/dL (ref 3.6–5.1)
Alkaline phosphatase (APISO): 51 U/L (ref 31–125)
BUN: 11 mg/dL (ref 7–25)
CO2: 34 mmol/L — ABNORMAL HIGH (ref 20–32)
Calcium: 9.3 mg/dL (ref 8.6–10.2)
Chloride: 100 mmol/L (ref 98–110)
Creat: 0.79 mg/dL (ref 0.50–0.99)
Globulin: 2.8 g/dL (ref 1.9–3.7)
Glucose, Bld: 79 mg/dL (ref 65–99)
Potassium: 3.8 mmol/L (ref 3.5–5.3)
Sodium: 141 mmol/L (ref 135–146)
Total Bilirubin: 0.6 mg/dL (ref 0.2–1.2)
Total Protein: 7.4 g/dL (ref 6.1–8.1)
eGFR: 92 mL/min/{1.73_m2}

## 2024-12-21 LAB — VITAMIN D 25 HYDROXY (VIT D DEFICIENCY, FRACTURES): Vit D, 25-Hydroxy: 22 ng/mL — ABNORMAL LOW (ref 30–100)

## 2024-12-21 LAB — VITAMIN B12: Vitamin B-12: 400 pg/mL (ref 200–1100)

## 2024-12-22 MED ORDER — VITAMIN D (ERGOCALCIFEROL) 1.25 MG (50000 UNIT) PO CAPS: 50000.0000 [IU] | ORAL_CAPSULE | ORAL | 0 refills | Status: AC

## 2024-12-22 NOTE — Progress Notes (Signed)
 These inform patient:  The blood work showed no sign of anemia. Electrolytes, liver, kidney, thyroid and vitamin B12 are normal.  Total cholesterol and LDL slightly elevated compared to the previous labs.  Would recommend diet management and exercise.  Vit D is low. I have sent 50,000 units of Vit D prescription to the pharmacy weekly for 12 weeks. After completing the prescription you can take take OTC 2000 units daily.

## 2024-12-23 NOTE — Assessment & Plan Note (Signed)
 Previous low vitamin D  level. -Will check vitamin D  level. Orders:   VITAMIN D  25 Hydroxy (Vit-D Deficiency, Fractures)

## 2024-12-23 NOTE — Assessment & Plan Note (Addendum)
 Body mass index is 23.87 kg/m.  Stable with medication - Continue Zepbound  2.5 mg weekly.

## 2025-06-16 ENCOUNTER — Encounter: Admitting: Nurse Practitioner

## 2025-07-25 ENCOUNTER — Encounter: Admitting: Dermatology

## 2025-07-26 ENCOUNTER — Encounter: Admitting: Dermatology
